# Patient Record
Sex: Female | Born: 1980 | Race: Black or African American | Hispanic: No | Marital: Single | Smoking: Current every day smoker
Health system: Southern US, Community
[De-identification: ages and names within clinical notes are randomized; demographics above are authoritative.]

## PROBLEM LIST (undated history)

## (undated) ENCOUNTER — Inpatient Hospital Stay (HOSPITAL_COMMUNITY): Payer: Self-pay

## (undated) DIAGNOSIS — R51 Headache: Secondary | ICD-10-CM

## (undated) DIAGNOSIS — K029 Dental caries, unspecified: Secondary | ICD-10-CM

## (undated) DIAGNOSIS — J349 Unspecified disorder of nose and nasal sinuses: Secondary | ICD-10-CM

## (undated) DIAGNOSIS — T7840XA Allergy, unspecified, initial encounter: Secondary | ICD-10-CM

## (undated) DIAGNOSIS — R112 Nausea with vomiting, unspecified: Secondary | ICD-10-CM

## (undated) DIAGNOSIS — Z9889 Other specified postprocedural states: Secondary | ICD-10-CM

## (undated) DIAGNOSIS — I10 Essential (primary) hypertension: Secondary | ICD-10-CM

## (undated) HISTORY — PX: WISDOM TOOTH EXTRACTION: SHX21

## (undated) HISTORY — PX: DILATION AND CURETTAGE OF UTERUS: SHX78

## (undated) HISTORY — PX: NOSE SURGERY: SHX723

## (undated) HISTORY — DX: Allergy, unspecified, initial encounter: T78.40XA

---

## 1999-02-01 ENCOUNTER — Emergency Department (HOSPITAL_COMMUNITY): Admission: EM | Admit: 1999-02-01 | Discharge: 1999-02-01 | Payer: Self-pay | Admitting: Emergency Medicine

## 1999-06-22 ENCOUNTER — Other Ambulatory Visit: Admission: RE | Admit: 1999-06-22 | Discharge: 1999-06-22 | Payer: Self-pay | Admitting: Obstetrics

## 2002-07-09 ENCOUNTER — Encounter: Payer: Self-pay | Admitting: Obstetrics

## 2002-07-09 ENCOUNTER — Ambulatory Visit (HOSPITAL_COMMUNITY): Admission: RE | Admit: 2002-07-09 | Discharge: 2002-07-09 | Payer: Self-pay | Admitting: Obstetrics

## 2002-07-11 ENCOUNTER — Ambulatory Visit (HOSPITAL_COMMUNITY): Admission: RE | Admit: 2002-07-11 | Discharge: 2002-07-11 | Payer: Self-pay | Admitting: Obstetrics

## 2002-07-11 ENCOUNTER — Encounter (INDEPENDENT_AMBULATORY_CARE_PROVIDER_SITE_OTHER): Payer: Self-pay

## 2003-04-09 ENCOUNTER — Inpatient Hospital Stay (HOSPITAL_COMMUNITY): Admission: AD | Admit: 2003-04-09 | Discharge: 2003-04-09 | Payer: Self-pay | Admitting: Obstetrics

## 2003-04-09 ENCOUNTER — Encounter: Payer: Self-pay | Admitting: Obstetrics

## 2003-06-05 ENCOUNTER — Inpatient Hospital Stay (HOSPITAL_COMMUNITY): Admission: AD | Admit: 2003-06-05 | Discharge: 2003-06-05 | Payer: Self-pay | Admitting: Obstetrics

## 2003-07-21 ENCOUNTER — Inpatient Hospital Stay (HOSPITAL_COMMUNITY): Admission: AD | Admit: 2003-07-21 | Discharge: 2003-07-22 | Payer: Self-pay | Admitting: Obstetrics

## 2003-08-06 ENCOUNTER — Inpatient Hospital Stay (HOSPITAL_COMMUNITY): Admission: AD | Admit: 2003-08-06 | Discharge: 2003-08-06 | Payer: Self-pay | Admitting: Obstetrics

## 2003-08-11 ENCOUNTER — Inpatient Hospital Stay (HOSPITAL_COMMUNITY): Admission: AD | Admit: 2003-08-11 | Discharge: 2003-08-11 | Payer: Self-pay | Admitting: Obstetrics

## 2003-08-15 ENCOUNTER — Encounter (INDEPENDENT_AMBULATORY_CARE_PROVIDER_SITE_OTHER): Payer: Self-pay | Admitting: *Deleted

## 2003-08-15 ENCOUNTER — Inpatient Hospital Stay (HOSPITAL_COMMUNITY): Admission: AD | Admit: 2003-08-15 | Discharge: 2003-08-18 | Payer: Self-pay | Admitting: Obstetrics

## 2003-12-20 ENCOUNTER — Inpatient Hospital Stay (HOSPITAL_COMMUNITY): Admission: AD | Admit: 2003-12-20 | Discharge: 2003-12-20 | Payer: Self-pay | Admitting: Obstetrics

## 2004-04-03 ENCOUNTER — Emergency Department (HOSPITAL_COMMUNITY): Admission: EM | Admit: 2004-04-03 | Discharge: 2004-04-03 | Payer: Self-pay | Admitting: Emergency Medicine
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
## 2007-02-09 ENCOUNTER — Emergency Department (HOSPITAL_COMMUNITY): Admission: EM | Admit: 2007-02-09 | Discharge: 2007-02-09 | Payer: Self-pay | Admitting: Family Medicine

## 2010-12-25 NOTE — H&P (Signed)
NAME:  ARIS, MOMAN                       ACCOUNT NO.:  [id number]   MEDICAL RECORD NO.:  [id number]                   PATIENT TYPE:  INP   LOCATION:  9199                                 FACILITY:  [facility]   PHYSICIAN:  Kathreen Cosier, M.D.           DATE OF BIRTH:  1981-04-02   DATE OF ADMISSION:  08/15/2003  DATE OF DISCHARGE:                                HISTORY & PHYSICAL   REASON FOR ADMISSION:  The patient is a 30 year old gravida 4, para 1-0-2-1,  [facility] August 18, 2003, admitted in labor and at 6:45 a.m. she was 7 cm, 80%  vertex, molded to a minus 2 station, membranes are ruptured spontaneously at  3:45 a.m. and the fluid was clear.  She is a negative GBS.  IUP and scalp  inserted at time.  The patient was started on Pitocin stimulation for an  inadequate labor and labor became adequate and at 8 a.m. she was unchanged  and 11:45 she was unchanged.  There was molding to a minus 2 station.  She  was also having late decelerations with each contraction.  She was given  terbutaline 0.25 mg subcu and O2 and it was decided she would be delivered  by a C-section for a nonreassuring fetal heart rate tracing and failure to  progress in labor.   PHYSICAL EXAMINATION:  GENERAL:  A well-developed female in labor.  HEENT:  Negative.  LUNGS:  Clear.  HEART:  Regular rhythm.  No murmurs, no gallops.  ABDOMEN:  Term size uterus with estimated fetal weight of 8 pounds.  EXTREMITIES:  Negative edema.                                               Kathreen Cosier, M.D.    BAM/MEDQ  D:  08/15/2003  T:  08/15/2003  Job:  [id number]

## 2010-12-25 NOTE — Discharge Summary (Signed)
NAME:  Brandy Reed, Brandy Reed                       ACCOUNT NO.:  [id number]   MEDICAL RECORD NO.:  [id number]                   PATIENT TYPE:  INP   LOCATION:  9115                                 [facility]ILITY[facility]  [facility]   PHYSICIAN:  Kathreen Cosier, M.D.           DATE OF BIRTH:  23-Aug-1980   DATE OF ADMISSION:  08/15/2003  DATE OF DISCHARGE:                                 DISCHARGE SUMMARY   HOSPITAL COURSE:  The patient is a 30 year old gravida 4, para 1-0-2-1, [facility]  August 18, 2003 who was admitted in labor and underwent a primary low  transverse cesarean section because of failure to progress.  She had a  female, Apgars 9 and 9 from the OP position weighing 7 pounds 4 ounces.  Postop her hemoglobin was 9.5.  She did well and was discharged home on the  third postoperative day, ambulatory and on a regular diet, on Tylox for pain  and Micronor for contraception.   DISCHARGE DIAGNOSIS:  Status post primary low transverse cesarean section at  term for failure to progress in labor.                                               Kathreen Cosier, M.D.    BAM/MEDQ  D:  08/18/2003  T:  08/18/2003  Job:  [id number]

## 2010-12-25 NOTE — Op Note (Signed)
   NAME:  Brandy Reed, Brandy Reed                       ACCOUNT NO.:  [id number]   MEDICAL RECORD NO.:  [id number]                   PATIENT TYPE:  AMB   LOCATION:  SDC                                  [facility]ILITY:  [facility]   PHYSICIAN:  Kathreen Cosier, M.D.           DATE OF BIRTH:  [birth year]   DATE OF PROCEDURE:  07/11/2002  DATE OF DISCHARGE:                                 OPERATIVE REPORT   PREOPERATIVE DIAGNOSES:  Spontaneous incomplete abortion.   PROCEDURE:  Dilatation and evacuation.  Using MAC patient lithotomy  position, perineum and vaginal prepped and draped.  Bladder emptied with  straight catheter.  Weighted speculum placed in the vagina.  Cervix was  injected at 3, 9, and 12 o'clock with a total of 8 cc of 1% Xylocaine.  Anterior lip of the cervix grasped with tenaculum.  Cervix was noted to be  open, easily admitted a number 50 Pratt.  Number 9 suction curette was  inserted into the endometrial cavity and the cavity curetted until clean.  Moderate amount of tissue obtained.  The patient tolerated procedure well.  Taken to recovery room in good condition.                                               Kathreen Cosier, M.D.    BAM/MEDQ  D:  07/11/2002  T:  07/11/2002  Job:  [id number]

## 2010-12-25 NOTE — Op Note (Signed)
NAME:  Brandy Reed, Brandy Reed                       ACCOUNT NO.:  [id number]   MEDICAL RECORD NO.:  [id number]                   PATIENT TYPE:  INP   LOCATION:  9199                                 [facility]ILITY[facility]   PHYSICIAN:  Kathreen Cosier, M.D.           DATE OF BIRTH:  01-30-1981   DATE OF PROCEDURE:  08/15/2003  DATE OF DISCHARGE:                                 OPERATIVE REPORT   PREOPERATIVE DIAGNOSES:  1. Failure to progress in labor.  2. Nonreassuring fetal heart rate tracing.   ANESTHESIA:  Epidural.   SURGEON:  Kathreen Cosier, M.D.   DESCRIPTION OF PROCEDURE:  The patient was placed on the operating table in  supine position, abdomen prepped and draped, bladder emptied with a Foley  catheter.  A transverse suprapubic incision made, carried down to the rectus  fascia, and fascia cleaned and incised the length of the incision.  The  recti muscles retracted laterally, the peritoneum incised longitudinally.  A  transverse incision made in the visceral peritoneum above the bladder and  the bladder mobilized inferiorly.  A transverse low uterine incision made,  the patient delivered from the OP position of a female, Apgar 9/9, weighing  7 pounds 4 ounces.  The fluid was clear, and the team was in attendance.  The placenta was posterior and removed manually.  The uterine cavity cleaned  with a dry lap.  The uterine incision closed in one layer with a continuous  suture of #1 chromic.  Hemostasis was satisfactory, the bladder flap  reattached with 2-0 chromic.  The uterus was well-contracted, tubes and  ovaries normal.  Abdomen closed in layers, peritoneum with a continuous  suture of 0 chromic, fascia with continuous suture of 0 Dexon, and the skin  closed with subcuticular stitch of 3-0 Monocryl.  Blood loss 400 mL.                                               Kathreen Cosier, M.D.    BAM/MEDQ  D:  08/15/2003  T:  08/15/2003  Job:  [id number]

## 2013-11-04 ENCOUNTER — Encounter (HOSPITAL_COMMUNITY): Payer: Self-pay | Admitting: Medical

## 2013-11-04 ENCOUNTER — Inpatient Hospital Stay (HOSPITAL_COMMUNITY): Payer: Medicaid Other

## 2013-11-04 ENCOUNTER — Inpatient Hospital Stay (HOSPITAL_COMMUNITY)
Admission: AD | Admit: 2013-11-04 | Discharge: 2013-11-04 | Discharge status: Against Medical Advice | Payer: Medicaid Other | Source: Ambulatory Visit | Attending: Obstetrics & Gynecology | Admitting: Obstetrics & Gynecology

## 2013-11-04 DIAGNOSIS — O00109 Unspecified tubal pregnancy without intrauterine pregnancy: Secondary | ICD-10-CM | POA: Insufficient documentation

## 2013-11-04 DIAGNOSIS — O2390 Unspecified genitourinary tract infection in pregnancy, unspecified trimester: Secondary | ICD-10-CM | POA: Insufficient documentation

## 2013-11-04 DIAGNOSIS — O26899 Other specified pregnancy related conditions, unspecified trimester: Secondary | ICD-10-CM

## 2013-11-04 DIAGNOSIS — N831 Corpus luteum cyst of ovary, unspecified side: Secondary | ICD-10-CM | POA: Insufficient documentation

## 2013-11-04 DIAGNOSIS — N760 Acute vaginitis: Secondary | ICD-10-CM | POA: Insufficient documentation

## 2013-11-04 DIAGNOSIS — A499 Bacterial infection, unspecified: Secondary | ICD-10-CM | POA: Insufficient documentation

## 2013-11-04 DIAGNOSIS — B9689 Other specified bacterial agents as the cause of diseases classified elsewhere: Secondary | ICD-10-CM | POA: Insufficient documentation

## 2013-11-04 DIAGNOSIS — O9989 Other specified diseases and conditions complicating pregnancy, childbirth and the puerperium: Secondary | ICD-10-CM

## 2013-11-04 DIAGNOSIS — R109 Unspecified abdominal pain: Secondary | ICD-10-CM | POA: Insufficient documentation

## 2013-11-04 DIAGNOSIS — O34599 Maternal care for other abnormalities of gravid uterus, unspecified trimester: Secondary | ICD-10-CM | POA: Insufficient documentation

## 2013-11-04 LAB — CBC
HCT: 35.6 % — ABNORMAL LOW (ref 36.0–46.0)
HEMOGLOBIN: 11.6 g/dL — AB (ref 12.0–15.0)
MCH: 27.2 pg (ref 26.0–34.0)
MCHC: 32.6 g/dL (ref 30.0–36.0)
MCV: 83.4 fL (ref 78.0–100.0)
Platelets: 457 10*3/uL — ABNORMAL HIGH (ref 150–400)
RBC: 4.27 MIL/uL (ref 3.87–5.11)
RDW: 14.6 % (ref 11.5–15.5)
WBC: 9.6 10*3/uL (ref 4.0–10.5)

## 2013-11-04 LAB — URINALYSIS, ROUTINE W REFLEX MICROSCOPIC
BILIRUBIN URINE: NEGATIVE
GLUCOSE, UA: NEGATIVE mg/dL
Ketones, ur: NEGATIVE mg/dL
NITRITE: NEGATIVE
PH: 6 (ref 5.0–8.0)
Protein, ur: NEGATIVE mg/dL
Specific Gravity, Urine: 1.025 (ref 1.005–1.030)
Urobilinogen, UA: 0.2 mg/dL (ref 0.0–1.0)

## 2013-11-04 LAB — URINE MICROSCOPIC-ADD ON

## 2013-11-04 LAB — WET PREP, GENITAL
Trich, Wet Prep: NONE SEEN
Yeast Wet Prep HPF POC: NONE SEEN

## 2013-11-04 LAB — HCG, QUANTITATIVE, PREGNANCY: hCG, Beta Chain, Quant, S: 2244 m[IU]/mL — ABNORMAL HIGH (ref <5)

## 2013-11-04 LAB — ABO/RH: ABO/RH(D): A POS

## 2013-11-04 LAB — POCT PREGNANCY, URINE: Preg Test, Ur: POSITIVE — AB

## 2013-11-04 NOTE — MAU Note (Signed)
Pt presents with complaints of lower abdominal pain that started this morning. Says that it is a possibility that she is pregnant and wants to make sure everything is ok if she is pregnant.

## 2013-11-04 NOTE — MAU Provider Note (Signed)
History     CSN: [id number]  Arrival date and time: 11/04/13 1501   First Provider Initiated Contact with Patient 11/04/13 1536      Chief Complaint  Patient presents with  . Abdominal Pain   HPI Ms. Brandy Reed is a 33 y.o. G1P0 at [redacted]w[redacted]d who presents to MAU today with complaint of lower abdominal pain that started today. She states LMP was mid or end of February. She rates her pain at 7/10 now. She has not taken anything for pain. She noted a pink discharge today, but denies bleeding. She denies other vaginal discharge, UTI symptoms, fever or N/V/D today.   OB History   Grav Para Term Preterm Abortions TAB SAB Ect Mult Living   1               No past medical history on file.  No past surgical history on file.  No family history on file.  History  Substance Use Topics  . Smoking status: Not on file  . Smokeless tobacco: Not on file  . Alcohol Use: Not on file    Allergies: No Known Allergies  Prescriptions prior to admission  Medication Sig Dispense Refill  . cetirizine (ZYRTEC) 10 MG tablet Take 10 mg by mouth daily.      Marland Kitchen FLUoxetine (PROZAC) 20 MG capsule Take 20 mg by mouth daily.      . fluticasone (FLONASE) 50 MCG/ACT nasal spray Place 1 spray into both nostrils daily as needed for allergies or rhinitis.      . Oxycodone HCl 10 MG TABS Take 10 mg by mouth 3 (three) times daily as needed (pain).        Review of Systems  Constitutional: Negative for fever and malaise/fatigue.  Gastrointestinal: Positive for abdominal pain. Negative for nausea, vomiting, diarrhea and constipation.  Genitourinary: Negative for dysuria, urgency and frequency.       Neg - vaginal bleeding, discharge   Physical Exam   Blood pressure 123/71, pulse 99, temperature 98.6 F (37 C), temperature source Oral, resp. rate 18, height 5\' 1"  (1.549 m), weight 185 lb (83.915 kg), last menstrual period 10/06/2013.  Physical Exam  Constitutional: She is oriented to person, place, and  time. She appears well-developed and well-nourished. No distress.  HENT:  Head: Normocephalic and atraumatic.  Cardiovascular: Normal rate.   Respiratory: Effort normal.  GI: Soft. Bowel sounds are normal. She exhibits no distension and no mass. There is no tenderness. There is no rebound and no guarding.  Genitourinary: Uterus is not enlarged (exam limited by maternal body habitus) and not tender. Cervix exhibits no motion tenderness, no discharge and no friability. Right adnexum displays tenderness (very mild). Right adnexum displays no mass. Left adnexum displays tenderness. Left adnexum displays no mass. No bleeding around the vagina. Vaginal discharge (scant thin, white discharge) found.  Neurological: She is alert and oriented to person, place, and time.  Skin: Skin is warm and dry. No erythema.  Psychiatric: She has a normal mood and affect.   Results for orders placed during the hospital encounter of 11/04/13 (from the past 24 hour(s))  URINALYSIS, ROUTINE W REFLEX MICROSCOPIC     Status: Abnormal   Collection Time    11/04/13  3:25 PM      Result Value Ref Range   Color, Urine YELLOW  YELLOW   APPearance CLEAR  CLEAR   Specific Gravity, Urine 1.025  1.005 - 1.030   pH 6.0  5.0 - 8.0  Glucose, UA NEGATIVE  NEGATIVE mg/dL   Hgb urine dipstick MODERATE (*) NEGATIVE   Bilirubin Urine NEGATIVE  NEGATIVE   Ketones, ur NEGATIVE  NEGATIVE mg/dL   Protein, ur NEGATIVE  NEGATIVE mg/dL   Urobilinogen, UA 0.2  0.0 - 1.0 mg/dL   Nitrite NEGATIVE  NEGATIVE   Leukocytes, UA TRACE (*) NEGATIVE  URINE MICROSCOPIC-ADD ON     Status: Abnormal   Collection Time    11/04/13  3:25 PM      Result Value Ref Range   Squamous Epithelial / LPF MANY (*) RARE   WBC, UA 3-6  <3 WBC/hpf   RBC / HPF 3-6  <3 RBC/hpf   Urine-Other MUCOUS PRESENT    POCT PREGNANCY, URINE     Status: Abnormal   Collection Time    11/04/13  3:29 PM      Result Value Ref Range   Preg Test, Ur POSITIVE (*) NEGATIVE  WET  PREP, GENITAL     Status: Abnormal   Collection Time    11/04/13  3:47 PM      Result Value Ref Range   Yeast Wet Prep HPF POC NONE SEEN  NONE SEEN   Trich, Wet Prep NONE SEEN  NONE SEEN   Clue Cells Wet Prep HPF POC MODERATE (*) NONE SEEN   WBC, Wet Prep HPF POC FEW (*) NONE SEEN  CBC     Status: Abnormal   Collection Time    11/04/13  4:00 PM      Result Value Ref Range   WBC 9.6  4.0 - 10.5 K/uL   RBC 4.27  3.87 - 5.11 MIL/uL   Hemoglobin 11.6 (*) 12.0 - 15.0 g/dL   HCT 16.1 (*) 09.6 - 04.5 %   MCV 83.4  78.0 - 100.0 fL   MCH 27.2  26.0 - 34.0 pg   MCHC 32.6  30.0 - 36.0 g/dL   RDW 40.9  81.1 - 91.4 %   Platelets 457 (*) 150 - 400 K/uL  HCG, QUANTITATIVE, PREGNANCY     Status: Abnormal   Collection Time    11/04/13  4:00 PM      Result Value Ref Range   hCG, Beta Chain, Quant, S 2244 (*) <5 mIU/mL  ABO/RH     Status: None   Collection Time    11/04/13  4:00 PM      Result Value Ref Range   ABO/RH(D) A POS     US Ob Comp Less 14 Wks  11/04/2013   CLINICAL DATA:  Pelvic pain and vaginal spotting. Six weeks and 3 days pregnant by last menstrual period. Quantitative beta HCG 2,244.  EXAM: OBSTETRIC <14 WK [place] AND TRANSVAGINAL OB US  TECHNIQUE: Both transabdominal and transvaginal ultrasound examinations were performed for complete evaluation of the gestation as well as the maternal uterus, adnexal regions, and pelvic cul-de-sac. Transvaginal technique was performed to assess early pregnancy.  COMPARISON:  None.  FINDINGS: Intrauterine gestational sac: Not visualized  Yolk sac:  Not visualized  Embryo:  Not visualized  Cardiac Activity: Not visualized  Maternal uterus/adnexae: Normal appearing right ovary. 2.3 cm and 1.6 cm simple cysts in the left ovary. There is also a complex cyst in the left ovary with diffuse wall thickening and internal septations. This measures 2.3 x 2.0 x 1.9 cm in maximum dimensions. No free peritoneal fluid.  IMPRESSION: 1. No intrauterine pregnancy seen. 2.  Probable complex left ovarian corpus luteum cyst. An ovarian ectopic pregnancy is much  less likely. Followup serial quantitative beta HCG determinations are recommended. If the beta HCG rises, a followup pelvic ultrasound would be recommended.   Electronically Signed   By: Gordan PaymentSteve  Reid M.D.   On: 11/04/2013 18:17   [place] Ob Transvaginal  11/04/2013   CLINICAL DATA:  Pelvic pain and vaginal spotting. Six weeks and 3 days pregnant by last menstrual period. Quantitative beta HCG 2,244.  EXAM: OBSTETRIC <14 WK US AND TRANSVAGINAL OB US  TECHNIQUE: Both transabdominal and transvaginal ultrasound examinations were performed for complete evaluation of the gestation as well as the maternal uterus, adnexal regions, and pelvic cul-de-sac. Transvaginal technique was performed to assess early pregnancy.  COMPARISON:  None.  FINDINGS: Intrauterine gestational sac: Not visualized  Yolk sac:  Not visualized  Embryo:  Not visualized  Cardiac Activity: Not visualized  Maternal uterus/adnexae: Normal appearing right ovary. 2.3 cm and 1.6 cm simple cysts in the left ovary. There is also a complex cyst in the left ovary with diffuse wall thickening and internal septations. This measures 2.3 x 2.0 x 1.9 cm in maximum dimensions. No free peritoneal fluid.  IMPRESSION: 1. No intrauterine pregnancy seen. 2. Probable complex left ovarian corpus luteum cyst. An ovarian ectopic pregnancy is much less likely. Followup serial quantitative beta HCG determinations are recommended. If the beta HCG rises, a followup pelvic ultrasound would be recommended.   Electronically Signed   By: Gordan PaymentSteve  Reid M.D.   On: 11/04/2013 18:17     MAU Course  Procedures None  MDM +UTP UA, Wet prep, GC/Chlamydia, ABO/Rh, quant hCG, CBC and US today Patient returned from US and then left MAU without consulting anyone prior to receiving final results or plan Attempted to contact patient by phone. LM for patient to return call to MAU.  Assessment and Plan   A: Positive pregnancy test Abdominal pain in pregnancy, antepartum Corpus luteum cyst vs ectopic pregnancy on the left Bacterial vaginosis  P: Patient left AMA prior to receiving final results Attempted to contact patient by phone with results. No answer. LM to call MAU Patient needs Rx for Flagyl sent to pharmacy once pharmacy is confirmed Patient also needs to return to MAU in 48 hours for follow-up quant hCG since IUP was not confirmed Patient should be given ectopic precautions  Freddi StarrJulie N Ethier, PA-C  11/04/2013, 6:28 PM

## 2013-11-04 NOTE — MAU Note (Signed)
Entered room to ask patient if she needed anything after returning from ultrasound to find room empty and patient gown lying on stretcher.

## 2013-11-05 LAB — GC/CHLAMYDIA PROBE AMP
CT PROBE, AMP APTIMA: NEGATIVE
GC PROBE AMP APTIMA: NEGATIVE

## 2014-02-02 ENCOUNTER — Inpatient Hospital Stay (HOSPITAL_COMMUNITY)
Admission: AD | Admit: 2014-02-02 | Discharge: 2014-02-03 | Discharge status: Against Medical Advice | Payer: Medicaid Other | Source: Ambulatory Visit | Attending: Obstetrics | Admitting: Obstetrics

## 2014-02-02 DIAGNOSIS — Z5320 Procedure and treatment not carried out because of patient's decision for unspecified reasons: Secondary | ICD-10-CM | POA: Insufficient documentation

## 2014-02-03 NOTE — MAU Note (Signed)
Not in lobby

## 2014-02-12 ENCOUNTER — Encounter (HOSPITAL_COMMUNITY): Payer: Self-pay | Admitting: *Deleted

## 2014-02-12 ENCOUNTER — Inpatient Hospital Stay (HOSPITAL_COMMUNITY)
Admission: AD | Admit: 2014-02-12 | Discharge: 2014-02-12 | Disposition: A | Discharge status: Home / Self Care | Payer: Medicaid Other | Source: Ambulatory Visit | Attending: Obstetrics | Admitting: Obstetrics

## 2014-02-12 ENCOUNTER — Inpatient Hospital Stay (HOSPITAL_COMMUNITY): Payer: Medicaid Other

## 2014-02-12 DIAGNOSIS — O2390 Unspecified genitourinary tract infection in pregnancy, unspecified trimester: Secondary | ICD-10-CM

## 2014-02-12 DIAGNOSIS — N390 Urinary tract infection, site not specified: Secondary | ICD-10-CM

## 2014-02-12 DIAGNOSIS — O2342 Unspecified infection of urinary tract in pregnancy, second trimester: Secondary | ICD-10-CM

## 2014-02-12 DIAGNOSIS — O99330 Smoking (tobacco) complicating pregnancy, unspecified trimester: Secondary | ICD-10-CM | POA: Insufficient documentation

## 2014-02-12 DIAGNOSIS — A499 Bacterial infection, unspecified: Secondary | ICD-10-CM | POA: Diagnosis not present

## 2014-02-12 DIAGNOSIS — B9689 Other specified bacterial agents as the cause of diseases classified elsewhere: Secondary | ICD-10-CM | POA: Insufficient documentation

## 2014-02-12 DIAGNOSIS — R109 Unspecified abdominal pain: Secondary | ICD-10-CM | POA: Insufficient documentation

## 2014-02-12 DIAGNOSIS — N760 Acute vaginitis: Secondary | ICD-10-CM | POA: Insufficient documentation

## 2014-02-12 HISTORY — DX: Unspecified disorder of nose and nasal sinuses: J34.9

## 2014-02-12 HISTORY — DX: Headache: R51

## 2014-02-12 LAB — WET PREP, GENITAL
TRICH WET PREP: NONE SEEN
Yeast Wet Prep HPF POC: NONE SEEN

## 2014-02-12 LAB — URINALYSIS, ROUTINE W REFLEX MICROSCOPIC
BILIRUBIN URINE: NEGATIVE
GLUCOSE, UA: NEGATIVE mg/dL
Ketones, ur: NEGATIVE mg/dL
Nitrite: NEGATIVE
PROTEIN: NEGATIVE mg/dL
Specific Gravity, Urine: 1.01 (ref 1.005–1.030)
UROBILINOGEN UA: 1 mg/dL (ref 0.0–1.0)
pH: 6.5 (ref 5.0–8.0)

## 2014-02-12 LAB — OB RESULTS CONSOLE GC/CHLAMYDIA
CHLAMYDIA, DNA PROBE: NEGATIVE
GC PROBE AMP, GENITAL: NEGATIVE

## 2014-02-12 LAB — URINE MICROSCOPIC-ADD ON

## 2014-02-12 MED ORDER — NITROFURANTOIN MONOHYD MACRO 100 MG PO CAPS: 100.0000 mg | ORAL_CAPSULE | Freq: Two times a day (BID) | ORAL | Status: DC

## 2014-02-12 MED ORDER — METRONIDAZOLE 500 MG PO TABS: 500.0000 mg | ORAL_TABLET | Freq: Two times a day (BID) | ORAL | Status: DC

## 2014-02-12 NOTE — MAU Provider Note (Signed)
Attestation of Attending Supervision of Advanced Practitioner (CNM/NP): Evaluation and management procedures were performed by the Advanced Practitioner under my supervision and collaboration. I have reviewed the Advanced Practitioner's note and chart, and I agree with the management and plan.  Colisha Redler H. 1:33 PM   

## 2014-02-12 NOTE — Progress Notes (Signed)
Patient was instructed to wait for discharge papers and e-sig. Patient was given verbal instrs by CNM then left without papers and e-sig.

## 2014-02-12 NOTE — MAU Provider Note (Signed)
History     CSN: [id number]  Arrival date and time: 02/12/14 [id number]   First Provider Initiated Contact with Patient 02/12/14 [phone]      Chief Complaint  Patient presents with  . Abdominal Pain   HPI  Pt is a 33 yo G6P4014 at 463w3d wks IUP by uncertainLMP.  Reports lower abdominal pain for several days. Pt stated came to MAU but did not wait to be seen on 02/03/14. States pain was worse this AM, feels like cramping. Across lower abdomen, intermittent. States she feels "irritated" when she urinates and her urine smells "strong." States she had an early U/S that did not show anything. States she was advised to return for F/U but never did. Has not had 1st appointment with Brandy Reed.  Denies vaginal bleeding or abnormal vaginal discharge.     Past Medical History  Diagnosis Date  . Depression     postpartum  . Sinus disorder     sometimes gets HA's  . YNWGNFAO(130.8Headache(784.0)     Past Surgical History  Procedure Laterality Date  . Cesarean section  X 3  . Dilation and curettage of uterus    . Nose surgery    . Wisdom tooth extraction      History reviewed. No pertinent family history.  History  Substance Use Topics  . Smoking status: Current Every Day Smoker -- 0.25 packs/day    Types: Cigars  . Smokeless tobacco: Never Used  . Alcohol Use: No    Allergies: No Known Allergies  No prescriptions prior to admission    Review of Systems  Gastrointestinal: Positive for abdominal pain (lower pelvis).  Genitourinary: Positive for dysuria and urgency. Negative for hematuria and flank pain.   Physical Exam   Blood pressure 123/62, pulse 80, temperature 98.4 F (36.9 C), temperature source Oral, resp. rate 18, height 5\' 3"  (1.6 m), weight 83.008 kg (183 lb), last menstrual period 10/06/2013.  Physical Exam  Constitutional: She is oriented to person, place, and time. She appears well-developed and well-nourished. No distress.  Appears uncomfortable  HENT:  Head: Normocephalic.   Neck: Normal range of motion. Neck supple.  Cardiovascular: Normal rate, regular rhythm and normal heart sounds.   Respiratory: Effort normal and breath sounds normal.  GI: Soft. There is no tenderness.  Genitourinary: No bleeding around the vagina. Vaginal discharge (white, creamy) found.  Neurological: She is alert and oriented to person, place, and time.  Skin: Skin is warm and dry.   Cervix - closed MAU Course  Procedures  Results for orders placed during the hospital encounter of 02/12/14 (from the past 24 hour(s))  URINALYSIS, ROUTINE W REFLEX MICROSCOPIC     Status: Abnormal   Collection Time    02/12/14  7:46 AM      Result Value Ref Range   Color, Urine YELLOW  YELLOW   APPearance HAZY (*) CLEAR   Specific Gravity, Urine 1.010  1.005 - 1.030   pH 6.5  5.0 - 8.0   Glucose, UA NEGATIVE  NEGATIVE mg/dL   Hgb urine dipstick MODERATE (*) NEGATIVE   Bilirubin Urine NEGATIVE  NEGATIVE   Ketones, ur NEGATIVE  NEGATIVE mg/dL   Protein, ur NEGATIVE  NEGATIVE mg/dL   Urobilinogen, UA 1.0  0.0 - 1.0 mg/dL   Nitrite NEGATIVE  NEGATIVE   Leukocytes, UA TRACE (*) NEGATIVE  URINE MICROSCOPIC-ADD ON     Status: Abnormal   Collection Time    02/12/14  7:46 AM  Result Value Ref Range   Squamous Epithelial / LPF FEW (*) RARE   WBC, UA 0-2  <3 WBC/hpf   RBC / HPF 3-6  <3 RBC/hpf   Bacteria, UA RARE  RARE  WET PREP, GENITAL     Status: Abnormal   Collection Time    02/12/14  8:57 AM      Result Value Ref Range   Yeast Wet Prep HPF POC NONE SEEN  NONE SEEN   Trich, Wet Prep NONE SEEN  NONE SEEN   Clue Cells Wet Prep HPF POC FEW (*) NONE SEEN   WBC, Wet Prep HPF POC FEW (*) NONE SEEN   OB Ultrasound: 18.6 wk IUP, normal scan  Assessment and Plan  33 yo [id number] at 337w3d wks IUP Abdominal Pain in Pregnancy UTI in Pregnancy Bacterial Vaginosis  Plan: Discharge to home RX Macrobid 100 mg BID x 7 days RX Flagyl 500 mg BID x 7 days Urine culture Schedule appt with Dr.  Gaynell Reed as planned  [facility],Brandy 02/12/2014, 8:32 AM

## 2014-02-12 NOTE — MAU Note (Addendum)
States she has had pain for several days. Came to MAU but did not wait to be seen. States pain was worse this AM. Across lower abdomen, intermittent. States she feels "irritated" when she pees and her urine smells "strong." States she had an early U/S that did not show anything. States she was advised to return for F/U but never did. Has not had 1st appointment with Dr. Gaynell FaceMarshall.

## 2014-02-13 LAB — URINE CULTURE
COLONY COUNT: NO GROWTH
Culture: NO GROWTH

## 2014-02-13 LAB — GC/CHLAMYDIA PROBE AMP
CT PROBE, AMP APTIMA: NEGATIVE
GC Probe RNA: NEGATIVE

## 2014-06-10 ENCOUNTER — Encounter (HOSPITAL_COMMUNITY): Payer: Self-pay | Admitting: *Deleted

## 2014-06-27 ENCOUNTER — Other Ambulatory Visit: Payer: Self-pay | Admitting: Obstetrics

## 2014-07-05 ENCOUNTER — Encounter (HOSPITAL_COMMUNITY)
Admission: RE | Admit: 2014-07-05 | Discharge: 2014-07-05 | Disposition: A | Discharge status: Home / Self Care | Payer: Medicaid Other | Source: Ambulatory Visit | Attending: Obstetrics | Admitting: Obstetrics

## 2014-07-05 ENCOUNTER — Encounter (HOSPITAL_COMMUNITY): Payer: Self-pay

## 2014-07-05 LAB — TYPE AND SCREEN
ABO/RH(D): A POS
ANTIBODY SCREEN: NEGATIVE

## 2014-07-05 LAB — CBC
HCT: 26.9 % — ABNORMAL LOW (ref 36.0–46.0)
HEMOGLOBIN: 8.8 g/dL — AB (ref 12.0–15.0)
MCH: 26 pg (ref 26.0–34.0)
MCHC: 32.7 g/dL (ref 30.0–36.0)
MCV: 79.6 fL (ref 78.0–100.0)
Platelets: 359 10*3/uL (ref 150–400)
RBC: 3.38 MIL/uL — ABNORMAL LOW (ref 3.87–5.11)
RDW: 14.6 % (ref 11.5–15.5)
WBC: 9.9 10*3/uL (ref 4.0–10.5)

## 2014-07-05 NOTE — Pre-Procedure Instructions (Addendum)
No prenatal labs available for me to chart in results console.Dr's office is closed. There are no labs results in EPIC except GC and chlamydia. Today is [place], the day after Thanksgiving and surgery is on Monday- [facility] and ask if they will obtain labs from office on Monday am.

## 2014-07-05 NOTE — H&P (Signed)
NAMEstill Dooms:  Brandy Reed, Brandy Reed             ACCOUNT NO.:  [id number]  MEDICAL RECORD NO.:  [id number]  LOCATION:                                 FACILITY:  PHYSICIAN:  Kathreen CosierBernard A. Marshall, M.D.DATE OF BIRTH:  03/10/1981  DATE OF ADMISSION: DATE OF DISCHARGE:                             HISTORY & PHYSICAL   HISTORY:  The patient is a 33 year old, gravida 6, para 4-0-1-4, who has had 3 C-sections and 1 vaginal delivery and 1 spontaneous abortion.  She is due on July 13, 2014 and is in for a repeat C-section.  Her GBS was positive.  Her prenatal care started late at 30 weeks and she never did get diabetic screen.  PAST MEDICAL HISTORY:  Negative.  PAST SURGICAL HISTORY:  C-sections x3.  SOCIAL HISTORY:  She smokes.  SYSTEMIC REVIEW:  Noncontributory.  PHYSICAL EXAMINATION:  GENERAL:  A well-developed female in no distress. HEENT:  Negative. LUNGS:  Clear to P and A. HEART:  Regular rhythm.  No murmurs.  No gallops. BREASTS:  Negative. ABDOMEN:  Term. PELVIC:  Deferred. EXTREMITIES:  Negative.          ______________________________ Kathreen CosierBernard A. Marshall, M.D.     BAM/MEDQ  D:  07/05/2014  T:  07/05/2014  Job:  [id number]

## 2014-07-05 NOTE — Pre-Procedure Instructions (Signed)
Pt had 2 high BP readings during PAT visit. Pt stated she ate ham yesterday. She states she usually has normal BP readings in office. I called office to alert Dr. Gaynell FaceMarshall but office is closed and Dr. Clearance CootsHarper on call. I didn't call Dr. Clearance CootsHarper since not an emergency but instructed patient to drink water and refrain from eating more ham.

## 2014-07-05 NOTE — Patient Instructions (Addendum)
Your procedure is scheduled on:07/08/14  Enter through the Main Entrance at : 11:15 am Pick up desk phone and dial [phone] and inform us of your arrival.  Please call [phone] if you have any problems the morning of surgery.  Remember: Do not eat food after midnight:Sunday Clear liquids are ok until: 8am    You may brush your teeth the morning of surgery.  DO NOT wear jewelry, eye make-up, lipstick,body lotion, or dark fingernail polish.  (Polished toes are ok) You may wear deodorant.  If you are to be admitted after surgery, leave suitcase in car until your room has been assigned. Patients discharged on the day of surgery will not be allowed to drive home. Wear loose fitting, comfortable clothes for your ride home.

## 2014-07-06 LAB — RPR

## 2014-07-07 ENCOUNTER — Inpatient Hospital Stay (HOSPITAL_COMMUNITY): Payer: Medicaid Other | Admitting: Anesthesiology

## 2014-07-07 ENCOUNTER — Encounter (HOSPITAL_COMMUNITY)
Admission: AD | Disposition: A | Discharge status: Home / Self Care | Payer: Self-pay | Source: Ambulatory Visit | Attending: Obstetrics

## 2014-07-07 ENCOUNTER — Inpatient Hospital Stay (HOSPITAL_COMMUNITY): Payer: Medicaid Other

## 2014-07-07 ENCOUNTER — Inpatient Hospital Stay (HOSPITAL_COMMUNITY)
Admission: AD | Admit: 2014-07-07 | Discharge: 2014-07-10 | DRG: 765 | Disposition: A | Discharge status: Home / Self Care | Payer: Medicaid Other | Source: Ambulatory Visit | Attending: Obstetrics | Admitting: Obstetrics

## 2014-07-07 ENCOUNTER — Encounter (HOSPITAL_COMMUNITY): Payer: Self-pay | Admitting: *Deleted

## 2014-07-07 DIAGNOSIS — Z3A39 39 weeks gestation of pregnancy: Secondary | ICD-10-CM | POA: Diagnosis present

## 2014-07-07 DIAGNOSIS — O99334 Smoking (tobacco) complicating childbirth: Secondary | ICD-10-CM | POA: Diagnosis present

## 2014-07-07 DIAGNOSIS — O288 Other abnormal findings on antenatal screening of mother: Secondary | ICD-10-CM | POA: Insufficient documentation

## 2014-07-07 DIAGNOSIS — O4693 Antepartum hemorrhage, unspecified, third trimester: Secondary | ICD-10-CM

## 2014-07-07 DIAGNOSIS — O1493 Unspecified pre-eclampsia, third trimester: Secondary | ICD-10-CM | POA: Diagnosis present

## 2014-07-07 DIAGNOSIS — Z98891 History of uterine scar from previous surgery: Secondary | ICD-10-CM

## 2014-07-07 DIAGNOSIS — O4690 Antepartum hemorrhage, unspecified, unspecified trimester: Secondary | ICD-10-CM | POA: Insufficient documentation

## 2014-07-07 DIAGNOSIS — O3421 Maternal care for scar from previous cesarean delivery: Secondary | ICD-10-CM | POA: Diagnosis present

## 2014-07-07 DIAGNOSIS — Z3483 Encounter for supervision of other normal pregnancy, third trimester: Secondary | ICD-10-CM | POA: Diagnosis present

## 2014-07-07 LAB — CBC
HCT: 27.7 % — ABNORMAL LOW (ref 36.0–46.0)
Hemoglobin: 9.1 g/dL — ABNORMAL LOW (ref 12.0–15.0)
MCH: 25.9 pg — AB (ref 26.0–34.0)
MCHC: 32.9 g/dL (ref 30.0–36.0)
MCV: 78.9 fL (ref 78.0–100.0)
PLATELETS: 342 10*3/uL (ref 150–400)
RBC: 3.51 MIL/uL — ABNORMAL LOW (ref 3.87–5.11)
RDW: 14.6 % (ref 11.5–15.5)
WBC: 13.2 10*3/uL — ABNORMAL HIGH (ref 4.0–10.5)

## 2014-07-07 LAB — URINALYSIS, ROUTINE W REFLEX MICROSCOPIC
BILIRUBIN URINE: NEGATIVE
Glucose, UA: NEGATIVE mg/dL
KETONES UR: 15 mg/dL — AB
NITRITE: POSITIVE — AB
Protein, ur: 30 mg/dL — AB
Specific Gravity, Urine: 1.01 (ref 1.005–1.030)
UROBILINOGEN UA: 1 mg/dL (ref 0.0–1.0)
pH: 6.5 (ref 5.0–8.0)

## 2014-07-07 LAB — COMPREHENSIVE METABOLIC PANEL
ALT: 10 U/L (ref 0–35)
AST: 20 U/L (ref 0–37)
Albumin: 2.7 g/dL — ABNORMAL LOW (ref 3.5–5.2)
Alkaline Phosphatase: 142 U/L — ABNORMAL HIGH (ref 39–117)
Anion gap: 13 (ref 5–15)
BUN: 6 mg/dL (ref 6–23)
CO2: 23 mEq/L (ref 19–32)
Calcium: 8.6 mg/dL (ref 8.4–10.5)
Chloride: 98 mEq/L (ref 96–112)
Creatinine, Ser: 0.64 mg/dL (ref 0.50–1.10)
GFR calc non Af Amer: >90 mL/min (ref >90)
Glucose, Bld: 114 mg/dL — ABNORMAL HIGH (ref 70–99)
Potassium: 3.4 mEq/L — ABNORMAL LOW (ref 3.7–5.3)
SODIUM: 134 meq/L — AB (ref 137–147)
TOTAL PROTEIN: 6.3 g/dL (ref 6.0–8.3)
Total Bilirubin: 0.3 mg/dL (ref 0.3–1.2)

## 2014-07-07 LAB — TYPE AND SCREEN
ABO/RH(D): A POS
Antibody Screen: NEGATIVE

## 2014-07-07 LAB — URINE MICROSCOPIC-ADD ON

## 2014-07-07 LAB — PROTEIN / CREATININE RATIO, URINE
Creatinine, Urine: 111.3 mg/dL
PROTEIN CREATININE RATIO: 0.58 — AB (ref 0.00–0.15)
TOTAL PROTEIN, URINE: 64.4 mg/dL

## 2014-07-07 LAB — URIC ACID: Uric Acid, Serum: 4.4 mg/dL (ref 2.4–7.0)

## 2014-07-07 LAB — LACTATE DEHYDROGENASE: LDH: 187 U/L (ref 94–250)

## 2014-07-07 SURGERY — Surgical Case
Anesthesia: Spinal | Site: Abdomen

## 2014-07-07 MED ORDER — MAGNESIUM SULFATE BOLUS VIA INFUSION
4.0000 g | Freq: Once | INTRAVENOUS | Status: AC
  Administered 2014-07-07: 4 g via INTRAVENOUS
  Filled 2014-07-07: qty 500

## 2014-07-07 MED ORDER — NALOXONE HCL 1 MG/ML IJ SOLN
1.0000 ug/kg/h | INTRAVENOUS | Status: DC | PRN
  Filled 2014-07-07: qty 2

## 2014-07-07 MED ORDER — MORPHINE SULFATE 0.5 MG/ML IJ SOLN
INTRAMUSCULAR | Status: AC
  Filled 2014-07-07: qty 10

## 2014-07-07 MED ORDER — NALOXONE HCL 0.4 MG/ML IJ SOLN: 0.4000 mg | INTRAMUSCULAR | Status: DC | PRN

## 2014-07-07 MED ORDER — ACETAMINOPHEN 500 MG PO TABS
1000.0000 mg | ORAL_TABLET | Freq: Four times a day (QID) | ORAL | Status: AC
  Administered 2014-07-07 – 2014-07-08 (×3): 1000 mg via ORAL
  Filled 2014-07-07 (×3): qty 2

## 2014-07-07 MED ORDER — PHENYLEPHRINE 8 MG IN D5W 100 ML (0.08MG/ML) PREMIX OPTIME
INJECTION | INTRAVENOUS | Status: AC
  Filled 2014-07-07: qty 100

## 2014-07-07 MED ORDER — DIBUCAINE 1 % RE OINT: 1.0000 "application " | TOPICAL_OINTMENT | RECTAL | Status: DC | PRN

## 2014-07-07 MED ORDER — SCOPOLAMINE 1 MG/3DAYS TD PT72
MEDICATED_PATCH | TRANSDERMAL | Status: AC
  Filled 2014-07-07: qty 1

## 2014-07-07 MED ORDER — FENTANYL CITRATE 0.05 MG/ML IJ SOLN
25.0000 ug | INTRAMUSCULAR | Status: DC | PRN
  Administered 2014-07-07 (×4): 50 ug via INTRAVENOUS

## 2014-07-07 MED ORDER — SIMETHICONE 80 MG PO CHEW
80.0000 mg | CHEWABLE_TABLET | ORAL | Status: DC | PRN
  Administered 2014-07-09 – 2014-07-10 (×3): 80 mg via ORAL
  Filled 2014-07-07: qty 1

## 2014-07-07 MED ORDER — MORPHINE SULFATE 4 MG/ML IJ SOLN
4.0000 mg | Freq: Once | INTRAMUSCULAR | Status: AC
  Administered 2014-07-07: 4 mg via INTRAVENOUS

## 2014-07-07 MED ORDER — MEPERIDINE HCL 25 MG/ML IJ SOLN: 6.2500 mg | INTRAMUSCULAR | Status: DC | PRN

## 2014-07-07 MED ORDER — MENTHOL 3 MG MT LOZG: 1.0000 | LOZENGE | OROMUCOSAL | Status: DC | PRN

## 2014-07-07 MED ORDER — DIPHENHYDRAMINE HCL 25 MG PO CAPS: 25.0000 mg | ORAL_CAPSULE | Freq: Four times a day (QID) | ORAL | Status: DC | PRN

## 2014-07-07 MED ORDER — MAGNESIUM SULFATE 40 G IN LACTATED RINGERS - SIMPLE
2.0000 g/h | INTRAVENOUS | Status: DC
  Administered 2014-07-08: 2 g/h via INTRAVENOUS
  Filled 2014-07-07 (×2): qty 500

## 2014-07-07 MED ORDER — LACTATED RINGERS IV SOLN
INTRAVENOUS | Status: DC
  Administered 2014-07-07 (×3): via INTRAVENOUS

## 2014-07-07 MED ORDER — FENTANYL CITRATE 0.05 MG/ML IJ SOLN
INTRAMUSCULAR | Status: AC
  Filled 2014-07-07: qty 2

## 2014-07-07 MED ORDER — ALBUTEROL SULFATE HFA 108 (90 BASE) MCG/ACT IN AERS
INHALATION_SPRAY | RESPIRATORY_TRACT | Status: AC
  Filled 2014-07-07: qty 6.7

## 2014-07-07 MED ORDER — KETOROLAC TROMETHAMINE 30 MG/ML IJ SOLN
30.0000 mg | Freq: Four times a day (QID) | INTRAMUSCULAR | Status: DC | PRN
Start: 2014-07-07 — End: 2014-07-07

## 2014-07-07 MED ORDER — ZOLPIDEM TARTRATE 5 MG PO TABS
5.0000 mg | ORAL_TABLET | Freq: Every evening | ORAL | Status: DC | PRN
Start: 2014-07-07 — End: 2014-07-10

## 2014-07-07 MED ORDER — NALBUPHINE HCL 10 MG/ML IJ SOLN: 5.0000 mg | INTRAMUSCULAR | Status: DC | PRN

## 2014-07-07 MED ORDER — SCOPOLAMINE 1 MG/3DAYS TD PT72
1.0000 | MEDICATED_PATCH | Freq: Once | TRANSDERMAL | Status: DC
  Filled 2014-07-07: qty 1

## 2014-07-07 MED ORDER — SIMETHICONE 80 MG PO CHEW
80.0000 mg | CHEWABLE_TABLET | Freq: Three times a day (TID) | ORAL | Status: DC
  Administered 2014-07-07 – 2014-07-10 (×8): 80 mg via ORAL
  Filled 2014-07-07 (×10): qty 1

## 2014-07-07 MED ORDER — DIPHENHYDRAMINE HCL 50 MG/ML IJ SOLN: 12.5000 mg | INTRAMUSCULAR | Status: DC | PRN

## 2014-07-07 MED ORDER — LACTATED RINGERS IV SOLN
INTRAVENOUS | Status: DC
  Administered 2014-07-07 – 2014-07-08 (×2): via INTRAVENOUS

## 2014-07-07 MED ORDER — NALBUPHINE HCL 10 MG/ML IJ SOLN: 5.0000 mg | Freq: Once | INTRAMUSCULAR | Status: AC | PRN

## 2014-07-07 MED ORDER — HYDROMORPHONE HCL 1 MG/ML IJ SOLN: 1.0000 mg | Freq: Once | INTRAMUSCULAR | Status: DC

## 2014-07-07 MED ORDER — HYDROMORPHONE HCL 1 MG/ML IJ SOLN
INTRAMUSCULAR | Status: AC
  Filled 2014-07-07: qty 1

## 2014-07-07 MED ORDER — ONDANSETRON HCL 4 MG/2ML IJ SOLN: 4.0000 mg | Freq: Three times a day (TID) | INTRAMUSCULAR | Status: DC | PRN

## 2014-07-07 MED ORDER — KETOROLAC TROMETHAMINE 30 MG/ML IJ SOLN: 30.0000 mg | Freq: Four times a day (QID) | INTRAMUSCULAR | Status: DC | PRN

## 2014-07-07 MED ORDER — OXYCODONE-ACETAMINOPHEN 5-325 MG PO TABS
2.0000 | ORAL_TABLET | ORAL | Status: DC | PRN
  Administered 2014-07-08 – 2014-07-10 (×9): 2 via ORAL
  Filled 2014-07-07 (×10): qty 2

## 2014-07-07 MED ORDER — CEFAZOLIN SODIUM-DEXTROSE 2-3 GM-% IV SOLR
2.0000 g | INTRAVENOUS | Status: AC
  Administered 2014-07-07: 2 g via INTRAVENOUS
  Filled 2014-07-07: qty 50

## 2014-07-07 MED ORDER — SCOPOLAMINE 1 MG/3DAYS TD PT72
MEDICATED_PATCH | TRANSDERMAL | Status: DC | PRN
  Administered 2014-07-07: 1 via TRANSDERMAL

## 2014-07-07 MED ORDER — FENTANYL CITRATE 0.05 MG/ML IJ SOLN
INTRAMUSCULAR | Status: DC | PRN
  Administered 2014-07-07: 25 ug via INTRAVENOUS
  Administered 2014-07-07: 25 ug via INTRATHECAL

## 2014-07-07 MED ORDER — DEXAMETHASONE SODIUM PHOSPHATE 4 MG/ML IJ SOLN
INTRAMUSCULAR | Status: DC | PRN
  Administered 2014-07-07: 4 mg via INTRAVENOUS

## 2014-07-07 MED ORDER — ONDANSETRON HCL 4 MG/2ML IJ SOLN
INTRAMUSCULAR | Status: DC | PRN
  Administered 2014-07-07: 4 mg via INTRAVENOUS

## 2014-07-07 MED ORDER — SIMETHICONE 80 MG PO CHEW
80.0000 mg | CHEWABLE_TABLET | ORAL | Status: DC
  Administered 2014-07-07 – 2014-07-10 (×3): 80 mg via ORAL
  Filled 2014-07-07 (×3): qty 1

## 2014-07-07 MED ORDER — PROMETHAZINE HCL 25 MG/ML IJ SOLN: 6.2500 mg | INTRAMUSCULAR | Status: DC | PRN

## 2014-07-07 MED ORDER — KETOROLAC TROMETHAMINE 30 MG/ML IJ SOLN
15.0000 mg | Freq: Once | INTRAMUSCULAR | Status: AC | PRN
  Administered 2014-07-07: 30 mg via INTRAVENOUS

## 2014-07-07 MED ORDER — LANOLIN HYDROUS EX OINT: 1.0000 "application " | TOPICAL_OINTMENT | CUTANEOUS | Status: DC | PRN

## 2014-07-07 MED ORDER — TETANUS-DIPHTH-ACELL PERTUSSIS 5-2.5-18.5 LF-MCG/0.5 IM SUSP
0.5000 mL | Freq: Once | INTRAMUSCULAR | Status: DC
  Filled 2014-07-07: qty 0.5

## 2014-07-07 MED ORDER — SENNOSIDES-DOCUSATE SODIUM 8.6-50 MG PO TABS
2.0000 | ORAL_TABLET | ORAL | Status: DC
  Administered 2014-07-07 – 2014-07-10 (×3): 2 via ORAL
  Filled 2014-07-07 (×3): qty 2

## 2014-07-07 MED ORDER — DIPHENHYDRAMINE HCL 25 MG PO CAPS: 25.0000 mg | ORAL_CAPSULE | ORAL | Status: DC | PRN

## 2014-07-07 MED ORDER — OXYCODONE-ACETAMINOPHEN 5-325 MG PO TABS
1.0000 | ORAL_TABLET | ORAL | Status: DC | PRN
  Administered 2014-07-07 – 2014-07-09 (×3): 1 via ORAL
  Filled 2014-07-07 (×3): qty 1

## 2014-07-07 MED ORDER — HYDROMORPHONE HCL 1 MG/ML IJ SOLN
1.0000 mg | Freq: Once | INTRAMUSCULAR | Status: AC
  Administered 2014-07-07: 1 mg via INTRAVENOUS

## 2014-07-07 MED ORDER — MORPHINE SULFATE (PF) 0.5 MG/ML IJ SOLN
INTRAMUSCULAR | Status: DC | PRN
  Administered 2014-07-07: .15 mg via INTRATHECAL

## 2014-07-07 MED ORDER — BUPIVACAINE IN DEXTROSE 0.75-8.25 % IT SOLN
INTRATHECAL | Status: DC | PRN
  Administered 2014-07-07: 12 mg via INTRATHECAL

## 2014-07-07 MED ORDER — IBUPROFEN 600 MG PO TABS
600.0000 mg | ORAL_TABLET | Freq: Four times a day (QID) | ORAL | Status: DC
  Administered 2014-07-07 – 2014-07-10 (×13): 600 mg via ORAL
  Filled 2014-07-07 (×14): qty 1

## 2014-07-07 MED ORDER — IBUPROFEN 600 MG PO TABS
600.0000 mg | ORAL_TABLET | Freq: Four times a day (QID) | ORAL | Status: DC | PRN
  Administered 2014-07-09: 600 mg via ORAL

## 2014-07-07 MED ORDER — ONDANSETRON HCL 4 MG/2ML IJ SOLN
INTRAMUSCULAR | Status: AC
  Filled 2014-07-07: qty 2

## 2014-07-07 MED ORDER — SODIUM CHLORIDE 0.9 % IJ SOLN: 3.0000 mL | INTRAMUSCULAR | Status: DC | PRN

## 2014-07-07 MED ORDER — CITRIC ACID-SODIUM CITRATE 334-500 MG/5ML PO SOLN
30.0000 mL | Freq: Once | ORAL | Status: AC
  Administered 2014-07-07: 30 mL via ORAL

## 2014-07-07 MED ORDER — MIDAZOLAM HCL 2 MG/2ML IJ SOLN: 0.5000 mg | Freq: Once | INTRAMUSCULAR | Status: DC | PRN

## 2014-07-07 MED ORDER — OXYTOCIN 10 UNIT/ML IJ SOLN
40.0000 [IU] | INTRAVENOUS | Status: DC | PRN
  Administered 2014-07-07: 40 [IU] via INTRAVENOUS

## 2014-07-07 MED ORDER — OXYTOCIN 40 UNITS IN LACTATED RINGERS INFUSION - SIMPLE MED: 62.5000 mL/h | INTRAVENOUS | Status: AC

## 2014-07-07 MED ORDER — NITROFURANTOIN MONOHYD MACRO 100 MG PO CAPS
100.0000 mg | ORAL_CAPSULE | Freq: Two times a day (BID) | ORAL | Status: DC
  Administered 2014-07-07 – 2014-07-10 (×7): 100 mg via ORAL
  Filled 2014-07-07 (×9): qty 1

## 2014-07-07 MED ORDER — OXYTOCIN 10 UNIT/ML IJ SOLN
INTRAMUSCULAR | Status: AC
  Filled 2014-07-07: qty 4

## 2014-07-07 MED ORDER — KETOROLAC TROMETHAMINE 30 MG/ML IJ SOLN
INTRAMUSCULAR | Status: AC
  Filled 2014-07-07: qty 1

## 2014-07-07 MED ORDER — PRENATAL MULTIVITAMIN CH
1.0000 | ORAL_TABLET | Freq: Every day | ORAL | Status: DC
  Administered 2014-07-08 – 2014-07-10 (×3): 1 via ORAL
  Filled 2014-07-07 (×3): qty 1

## 2014-07-07 MED ORDER — MORPHINE SULFATE 4 MG/ML IJ SOLN
INTRAMUSCULAR | Status: AC
  Filled 2014-07-07: qty 1

## 2014-07-07 MED ORDER — PHENYLEPHRINE 8 MG IN D5W 100 ML (0.08MG/ML) PREMIX OPTIME
INJECTION | INTRAVENOUS | Status: DC | PRN
  Administered 2014-07-07: 50 ug/min via INTRAVENOUS

## 2014-07-07 MED ORDER — ONDANSETRON HCL 4 MG/2ML IJ SOLN: 4.0000 mg | INTRAMUSCULAR | Status: DC | PRN

## 2014-07-07 MED ORDER — CITRIC ACID-SODIUM CITRATE 334-500 MG/5ML PO SOLN
ORAL | Status: AC
  Filled 2014-07-07: qty 15

## 2014-07-07 MED ORDER — WITCH HAZEL-GLYCERIN EX PADS: 1.0000 "application " | MEDICATED_PAD | CUTANEOUS | Status: DC | PRN

## 2014-07-07 MED ORDER — ONDANSETRON HCL 4 MG PO TABS: 4.0000 mg | ORAL_TABLET | ORAL | Status: DC | PRN

## 2014-07-07 SURGICAL SUPPLY — 39 items
ADH SKN CLS LQ APL DERMABOND (GAUZE/BANDAGES/DRESSINGS) ×2
CLAMP CORD UMBIL (MISCELLANEOUS) IMPLANT
CLOTH BEACON ORANGE TIMEOUT ST (SAFETY) ×3 IMPLANT
DERMABOND ADHESIVE PROPEN (GAUZE/BANDAGES/DRESSINGS) ×4
DERMABOND ADVANCED .7 DNX6 (GAUZE/BANDAGES/DRESSINGS) IMPLANT
DRAPE SHEET LG 3/4 BI-LAMINATE (DRAPES) IMPLANT
DRSG OPSITE 11X17.75 LRG (GAUZE/BANDAGES/DRESSINGS) ×2 IMPLANT
DRSG OPSITE POSTOP 4X10 (GAUZE/BANDAGES/DRESSINGS) ×1 IMPLANT
DURAPREP 26ML APPLICATOR (WOUND CARE) ×3 IMPLANT
ELECT REM PT RETURN 9FT ADLT (ELECTROSURGICAL) ×3
ELECTRODE REM PT RTRN 9FT ADLT (ELECTROSURGICAL) ×1 IMPLANT
EXTRACTOR VACUUM M CUP 4 TUBE (SUCTIONS) IMPLANT
EXTRACTOR VACUUM M CUP 4' TUBE (SUCTIONS)
GLOVE BIO SURGEON STRL SZ8.5 (GLOVE) ×3 IMPLANT
GOWN STRL REUS W/TWL 2XL LVL3 (GOWN DISPOSABLE) ×3 IMPLANT
GOWN STRL REUS W/TWL LRG LVL3 (GOWN DISPOSABLE) ×3 IMPLANT
KIT ABG SYR 3ML LUER SLIP (SYRINGE) IMPLANT
LIQUID BAND (GAUZE/BANDAGES/DRESSINGS) ×3 IMPLANT
NDL HYPO 25X5/8 SAFETYGLIDE (NEEDLE) ×1 IMPLANT
NEEDLE HYPO 25X5/8 SAFETYGLIDE (NEEDLE) ×3 IMPLANT
NS IRRIG 1000ML POUR BTL (IV SOLUTION) ×3 IMPLANT
PACK C SECTION WH (CUSTOM PROCEDURE TRAY) ×3 IMPLANT
PAD OB MATERNITY 4.3X12.25 (PERSONAL CARE ITEMS) ×3 IMPLANT
STAPLER VISISTAT 35W (STAPLE) IMPLANT
SUT CHROMIC 0 CT 802H (SUTURE) ×3 IMPLANT
SUT CHROMIC 0 CTX 36 (SUTURE) ×2 IMPLANT
SUT CHROMIC 0 MO4 CR (SUTURE) IMPLANT
SUT CHROMIC 1 CTX 36 (SUTURE) ×6 IMPLANT
SUT CHROMIC 2 0 SH (SUTURE) ×3 IMPLANT
SUT GUT PLAIN 0 CT-3 TAN 27 (SUTURE) IMPLANT
SUT MON AB 4-0 PS1 27 (SUTURE) ×3 IMPLANT
SUT PDS AB 0 CTX 36 PDP370T (SUTURE) IMPLANT
SUT VIC AB 0 CT1 18XCR BRD8 (SUTURE) IMPLANT
SUT VIC AB 0 CT1 8-18 (SUTURE)
SUT VIC AB 0 CTX 36 (SUTURE) ×6
SUT VIC AB 0 CTX36XBRD ANBCTRL (SUTURE) ×2 IMPLANT
TOWEL OR 17X24 6PK STRL BLUE (TOWEL DISPOSABLE) ×3 IMPLANT
TRAY FOLEY CATH 14FR (SET/KITS/TRAYS/PACK) ×3 IMPLANT
WATER STERILE IRR 1000ML POUR (IV SOLUTION) ×3 IMPLANT

## 2014-07-07 NOTE — Progress Notes (Signed)
Notified Dr Clearance CootsHarper of patients blood pressure results since coming to mother baby unit.

## 2014-07-07 NOTE — Op Note (Signed)
Preop diagnosis previous cesarean section at term in labor Postop diagnosis same Surgeon Dr. Francoise CeoBernard Jamin Reed (Asst. Dr. Coral Ceoharles Harper Anesthesia spinal Procedure patient placed on the operating table in the supine position after the spinal administered abdomen prepped and draped bladder emptied with a Foley catheter a transverse suprapubic incision made through the old scar carried to the rectus fascia fascia cleaned and incised the length of the incision recti muscles retracted laterally peritoneum incised longitudinally transverse incision made on the visceroperitoneum above the bladder and the bladder mobilized inferiorly transverse lower uterine incision made fluid clear patient delivered from the OB position of a female Apgar 9 and 9 weighing 8 lbs. 13 oz. the placenta was posterior removed manually and sent to labor and delivery uterine cavity clean with dry laps the uterine incision closed in one layer with continuous suture of #1 chromic hemostasis was satisfactory lap and sponge counts correct abdomen closed in layers peritoneum continuous with 2-0 chromic fascia continuous with of 0 Dexon and the skin shows a subcuticular stitch of 4-0 Monocryl blood loss was 700 cc patient tolerated the procedure well

## 2014-07-07 NOTE — H&P (Signed)
Patient scheduled for repeat C-section tomorrow and no comes in in labor and her C-section was done this a.m. the history and physical was already dictated

## 2014-07-07 NOTE — Progress Notes (Signed)
Pt has returned from "smoking". Discussed to effects of smoking on the pt as well as the baby. Instructed the pt not to leave the floor again for safety reasons as well. Nicotine patch was offered. Pt declined. Pt verbalized understanding of POC and agreed not to leave the hospital again. Also talked to pt about the risk of SIDS with parents that smoke around their newborn-pt verbalized understanding.

## 2014-07-07 NOTE — Progress Notes (Signed)
Pt just arrived to unit at 1728 and was asking to ambulate in the hallway prior to being started on Magnesium. [facility] staff asked to accompany pt to help with IV pole and foley. Pt ambulated to the elevator and told [facility] she was going to smoke. Security asked to retrieve pt. AC was made aware.

## 2014-07-07 NOTE — Progress Notes (Signed)
Acknowledged MD order for CSW consult.  Attempted visit with mother.  She was just assigned to a regular room and had several visitors.  She requested CSW return at a later time.

## 2014-07-07 NOTE — Progress Notes (Signed)
Notified Dr Clearance CootsHarper of patients blood pressures, see new orders.

## 2014-07-07 NOTE — Progress Notes (Signed)
Security unable to get pt to come back to room. Dr. Clearance CootsHarper called to report pt behavior. Will discuss with pt.

## 2014-07-07 NOTE — Anesthesia Preprocedure Evaluation (Signed)
Anesthesia Evaluation  Patient identified by MRN, date of birth, ID band Patient awake    Reviewed: Allergy & Precautions, H&P , NPO status , Patient's Chart, lab work & pertinent test results  Airway Mallampati: II       Dental   Pulmonary Current Smoker,  breath sounds clear to auscultation        Cardiovascular Exercise Tolerance: Good Rhythm:regular Rate:Normal     Neuro/Psych    GI/Hepatic   Endo/Other    Renal/GU      Musculoskeletal   Abdominal   Peds  Hematology   Anesthesia Other Findings Marginal insertion with bleeding NPO since 2am but OB says needs to go before adequately NPO  Reproductive/Obstetrics (+) Pregnancy                             Anesthesia Physical Anesthesia Plan  ASA: III and emergent  Anesthesia Plan: Spinal   Post-op Pain Management:    Induction:   Airway Management Planned:   Additional Equipment:   Intra-op Plan:   Post-operative Plan:   Informed Consent: I have reviewed the patients History and Physical, chart, labs and discussed the procedure including the risks, benefits and alternatives for the proposed anesthesia with the patient or authorized representative who has indicated his/her understanding and acceptance.     Plan Discussed with: Anesthesiologist, CRNA and Surgeon  Anesthesia Plan Comments:         Anesthesia Quick Evaluation

## 2014-07-07 NOTE — Plan of Care (Signed)
Problem: Phase I Progression Outcomes Goal: IS, TCDB as ordered Outcome: Completed/Met Date Met:  07/07/14

## 2014-07-07 NOTE — Anesthesia Procedure Notes (Signed)
Spinal Patient location during procedure: OR Start time: 07/07/2014 5:58 AM Staffing Anesthesiologist: Brayton CavesJACKSON, Salene Mohamud Performed by: anesthesiologist  Preanesthetic Checklist Completed: patient identified, site marked, surgical consent, pre-op evaluation, timeout performed, IV checked, risks and benefits discussed and monitors and equipment checked Spinal Block Patient position: sitting Prep: DuraPrep Patient monitoring: heart rate, cardiac monitor, continuous pulse ox and blood pressure Approach: midline Location: L3-4 Injection technique: single-shot Needle Needle type: Sprotte  Needle gauge: 24 G Needle length: 9 cm Assessment Sensory level: T4 Additional Notes Patient identified.  Risk benefits discussed including failed block, incomplete pain control, headache, nerve damage, paralysis, blood pressure changes, nausea, vomiting, reactions to medication both toxic or allergic, and postpartum back pain.  Patient expressed understanding and wished to proceed.  All questions were answered.  Sterile technique used throughout procedure.  CSF was clear.  No parasthesia or other complications.  Please see nursing notes for vital signs.

## 2014-07-07 NOTE — Transfer of Care (Signed)
Immediate Anesthesia Transfer of Care Note  Patient: Brandy Reed  Procedure(s) Performed: Procedure(s): CESAREAN SECTION (N/A)  Patient Location: PACU  Anesthesia Type:Spinal  Level of Consciousness: awake, alert  and oriented  Airway & Oxygen Therapy: Patient Spontanous Breathing  Post-op Assessment: Report given to PACU RN and Post -op Vital signs reviewed and stable  Post vital signs: Reviewed and stable  Complications: No apparent anesthesia complications

## 2014-07-07 NOTE — MAU Provider Note (Cosign Needed)
History     CSN: [id number]  Arrival date and time: 07/07/14 [id number]   First Provider Initiated Contact with Patient 07/07/14 [phone]      Chief Complaint  Patient presents with  . Vaginal Bleeding   HPI  Ms. Brandy Reed is a 33 y.o. [id number] at 947w1d who presents to MAU today with complaint of vaginal bleeding and contractions. She states that the bleeding started tonight. She is unsure of how much bleeding she has had, but states that there was "a lot" on her hand when she first noticed the bleeding. She states that she has been having more frequent contractions. She noted ~ q 5 minutes today. She denies any complications with this pregnancy, however states that she had elevated BP on Pfohl when she was at her pre-operative appointment. Patient is scheduled for a repeat C/S on Monday. She has had 3 previous c/s. She denies headache, blurred vision, floaters or abdominal pain today. She states mild bilateral ankle edema. She reports good fetal movement.   OB History    Gravida Para Term Preterm AB TAB SAB Ectopic Multiple Living   6 4 4  1  1   4       Past Medical History  Diagnosis Date  . Depression     postpartum  . Sinus disorder     sometimes gets HA's  . IONGEXBM(841.3Headache(784.0)     Past Surgical History  Procedure Laterality Date  . Cesarean section  X 3  . Dilation and curettage of uterus    . Nose surgery    . Wisdom tooth extraction      No family history on file.  History  Substance Use Topics  . Smoking status: Current Every Day Smoker -- 0.25 packs/day    Types: Cigars  . Smokeless tobacco: Never Used  . Alcohol Use: No    Allergies: No Known Allergies  Prescriptions prior to admission  Medication Sig Dispense Refill Last Dose  . metroNIDAZOLE (FLAGYL) 500 MG tablet Take 1 tablet (500 mg total) by mouth 2 (two) times daily. 14 tablet 0   . nitrofurantoin, macrocrystal-monohydrate, (MACROBID) 100 MG capsule Take 1 capsule (100 mg total) by mouth 2 (two)  times daily. 14 capsule 0     Review of Systems  Constitutional: Negative for fever and malaise/fatigue.  Eyes: Negative for blurred vision.  Gastrointestinal: Positive for abdominal pain.  Genitourinary:       + vaginal bleeding  Neurological: Negative for headaches.   Physical Exam   Blood pressure 160/79, pulse 91, temperature 98.8 F (37.1 C), temperature source Oral, resp. rate 22, last menstrual period 10/06/2013.  Physical Exam  Constitutional: She is oriented to person, place, and time. She appears well-developed and well-nourished. No distress.  HENT:  Head: Normocephalic.  Cardiovascular: Normal rate.   Respiratory: Effort normal.  GI: Soft. She exhibits no distension and no mass. There is no tenderness. There is no rebound and no guarding.  Genitourinary: There is bleeding (moderate amount of blood in the vaginal vault) in the vagina.  Musculoskeletal: She exhibits no edema.  Neurological: She is alert and oriented to person, place, and time. She has normal reflexes.  No clonus  Skin: Skin is warm and dry. No erythema.  Psychiatric: She has a normal mood and affect.     Results for orders placed or performed during the hospital encounter of 07/07/14 (from the past 24 hour(s))  Urinalysis, Routine w reflex microscopic     Status: Abnormal  Collection Time: 07/07/14  3:45 AM  Result Value Ref Range   Color, Urine YELLOW YELLOW   APPearance HAZY (A) CLEAR   Specific Gravity, Urine 1.010 1.005 - 1.030   pH 6.5 5.0 - 8.0   Glucose, UA NEGATIVE NEGATIVE mg/dL   Hgb urine dipstick LARGE (A) NEGATIVE   Bilirubin Urine NEGATIVE NEGATIVE   Ketones, ur 15 (A) NEGATIVE mg/dL   Protein, ur 30 (A) NEGATIVE mg/dL   Urobilinogen, UA 1.0 0.0 - 1.0 mg/dL   Nitrite POSITIVE (A) NEGATIVE   Leukocytes, UA SMALL (A) NEGATIVE  Protein / creatinine ratio, urine     Status: Abnormal   Collection Time: 07/07/14  3:45 AM  Result Value Ref Range   Creatinine, Urine 111.30 mg/dL    Total Protein, Urine 64.4 mg/dL   Protein Creatinine Ratio 0.58 (H) 0.00 - 0.15  Urine microscopic-add on     Status: Abnormal   Collection Time: 07/07/14  3:45 AM  Result Value Ref Range   Squamous Epithelial / LPF FEW (A) RARE   WBC, UA 0-2 <3 WBC/hpf   RBC / HPF 11-20 <3 RBC/hpf   Bacteria, UA MANY (A) RARE  CBC     Status: Abnormal   Collection Time: 07/07/14  3:50 AM  Result Value Ref Range   WBC 13.2 (H) 4.0 - 10.5 K/uL   RBC 3.51 (L) 3.87 - 5.11 MIL/uL   Hemoglobin 9.1 (L) 12.0 - 15.0 g/dL   HCT 16.127.7 (L) 09.636.0 - 04.546.0 %   MCV 78.9 78.0 - 100.0 fL   MCH 25.9 (L) 26.0 - 34.0 pg   MCHC 32.9 30.0 - 36.0 g/dL   RDW 40.914.6 81.111.5 - 91.415.5 %   Platelets 342 150 - 400 K/uL  Comprehensive metabolic panel     Status: Abnormal   Collection Time: 07/07/14  3:50 AM  Result Value Ref Range   Sodium 134 (L) 137 - 147 mEq/L   Potassium 3.4 (L) 3.7 - 5.3 mEq/L   Chloride 98 96 - 112 mEq/L   CO2 23 19 - 32 mEq/L   Glucose, Bld 114 (H) 70 - 99 mg/dL   BUN 6 6 - 23 mg/dL   Creatinine, Ser 7.820.64 0.50 - 1.10 mg/dL   Calcium 8.6 8.4 - 95.610.5 mg/dL   Total Protein 6.3 6.0 - 8.3 g/dL   Albumin 2.7 (L) 3.5 - 5.2 g/dL   AST 20 0 - 37 U/L   ALT 10 0 - 35 U/L   Alkaline Phosphatase 142 (H) 39 - 117 U/L   Total Bilirubin 0.3 0.3 - 1.2 mg/dL   GFR calc non Af Amer >90 >90 mL/min   GFR calc Af Amer >90 >90 mL/min   Anion gap 13 5 - 15  Uric acid     Status: None   Collection Time: 07/07/14  3:50 AM  Result Value Ref Range   Uric Acid, Serum 4.4 2.4 - 7.0 mg/dL  Lactate dehydrogenase     Status: None   Collection Time: 07/07/14  3:50 AM  Result Value Ref Range   LDH 187 94 - 250 U/L    Patient Vitals for the past 24 hrs:  BP Temp Temp src Pulse Resp  07/07/14 0346 160/79 mmHg - - 91 -  07/07/14 0334 172/86 mmHg - - 100 -  07/07/14 0317 173/90 mmHg - - 97 -  07/07/14 0315 160/97 mmHg - - 100 -  07/07/14 0310 165/88 mmHg - - 100 -  07/07/14 0308 165/86 mmHg - -  100 -  07/07/14 0253 170/98 mmHg  98.8 F (37.1 C) Oral 101 22    Fetal Monitoring: Baseline: 140 bpm, moderate variability, no accelerations, no decelerations Contractions: irregular q 2-6 minutes MAU Course  Procedures None  MDM [place] and BPP to assess fetal well-being and placenta due to non-reactive NST and vaginal bleeding that RN reports is more than bloody show BP elevated today. Patient is asymptomatic for pre-eclampsia and has normal exam. Labs ordered as noted below.  UA, urine protein/creatinine ratio, CBC, CMP, Uric acid, LDH and US/BPP today Fern - negative 0500 - Discussed patient, labs and [place] with Dr. Clearance Coots. Requests that we start IV fluid and draw type and screen. He will be here to consent the patient for surgery shortly.  Type and screen drawn. IV started.  1191 - Dr. Clearance Coots in MAU to assess patient. Patient is prepared for OR. Dr. Gaynell Face also in MAU.  Assessment and Plan  A: [facility]UP at [redacted]w[redacted]d Pre-eclampsia Vaginal bleeding in pregnancy  Previous C/S x 3  P: Patient to OR with Dr. Clearance Coots and Dr. Pollyann Samples, PA-C  07/07/2014, 5:27 AM

## 2014-07-07 NOTE — Anesthesia Postprocedure Evaluation (Signed)
  Anesthesia Post-op Note  Patient: Brandy Reed  Procedure(s) Performed: Procedure(s): CESAREAN SECTION (N/A)  Patient Location: PACU  Anesthesia Type:Spinal  Level of Consciousness: awake, alert  and oriented  Airway and Oxygen Therapy: Patient Spontanous Breathing  Post-op Pain: none  Post-op Assessment: Post-op Vital signs reviewed, Patient's Cardiovascular Status Stable, Respiratory Function Stable, Patent Airway, No signs of Nausea or vomiting, Pain level controlled, No headache, No backache, No residual numbness and No residual motor weakness  Post-op Vital Signs: Reviewed and stable  Last Vitals:  Filed Vitals:   07/07/14 0800  BP: 119/63  Pulse: 81  Temp:   Resp: 16    Complications: No apparent anesthesia complications

## 2014-07-07 NOTE — Anesthesia Preprocedure Evaluation (Signed)
Anesthesia Evaluation  Patient identified by MRN, date of birth, ID band Patient awake    Reviewed: Allergy & Precautions, H&P , NPO status , Patient's Chart, lab work & pertinent test results  Airway Mallampati: II       Dental   Pulmonary Current Smoker,  breath sounds clear to auscultation        Cardiovascular Exercise Tolerance: Good Rhythm:regular Rate:Normal     Neuro/Psych    GI/Hepatic   Endo/Other    Renal/GU      Musculoskeletal   Abdominal   Peds  Hematology   Anesthesia Other Findings Marginal insertion with bleeding NPO since 2am but OB says needs to go before adequately NPO  Reproductive/Obstetrics (+) Pregnancy                             Anesthesia Physical Anesthesia Plan  ASA: III and emergent  Anesthesia Plan: Spinal   Post-op Pain Management:    Induction:   Airway Management Planned:   Additional Equipment:   Intra-op Plan:   Post-operative Plan:   Informed Consent: I have reviewed the patients History and Physical, chart, labs and discussed the procedure including the risks, benefits and alternatives for the proposed anesthesia with the patient or authorized representative who has indicated his/her understanding and acceptance.     Plan Discussed with: Anesthesiologist, CRNA and Surgeon  Anesthesia Plan Comments:         Anesthesia Quick Evaluation  

## 2014-07-07 NOTE — Plan of Care (Signed)
Problem: Phase I Progression Outcomes Goal: Pain controlled with appropriate interventions Outcome: Completed/Met Date Met:  07/07/14 Goal: Foley catheter patent Outcome: Completed/Met Date Met:  07/07/14 Goal: OOB as tolerated unless otherwise ordered Outcome: Completed/Met Date Met:  07/07/14 Goal: VS, stable, temp < 100.4 degrees F Outcome: Completed/Met Date Met:  07/07/14 Goal: Initial discharge plan identified Outcome: Completed/Met Date Met:  07/07/14

## 2014-07-08 ENCOUNTER — Inpatient Hospital Stay (HOSPITAL_COMMUNITY): Admission: RE | Admit: 2014-07-08 | Payer: Medicaid Other | Source: Ambulatory Visit | Admitting: Obstetrics

## 2014-07-08 ENCOUNTER — Encounter (HOSPITAL_COMMUNITY): Payer: Self-pay | Admitting: Obstetrics

## 2014-07-08 DIAGNOSIS — O288 Other abnormal findings on antenatal screening of mother: Secondary | ICD-10-CM | POA: Insufficient documentation

## 2014-07-08 DIAGNOSIS — O4690 Antepartum hemorrhage, unspecified, unspecified trimester: Secondary | ICD-10-CM | POA: Insufficient documentation

## 2014-07-08 DIAGNOSIS — Z3A39 39 weeks gestation of pregnancy: Secondary | ICD-10-CM | POA: Insufficient documentation

## 2014-07-08 LAB — CBC
HEMATOCRIT: 22.6 % — AB (ref 36.0–46.0)
Hemoglobin: 7.4 g/dL — ABNORMAL LOW (ref 12.0–15.0)
MCH: 26 pg (ref 26.0–34.0)
MCHC: 32.7 g/dL (ref 30.0–36.0)
MCV: 79.3 fL (ref 78.0–100.0)
Platelets: 334 10*3/uL (ref 150–400)
RBC: 2.85 MIL/uL — AB (ref 3.87–5.11)
RDW: 14.7 % (ref 11.5–15.5)
WBC: 11.6 10*3/uL — AB (ref 4.0–10.5)

## 2014-07-08 SURGERY — Surgical Case
Anesthesia: Regional

## 2014-07-08 MED ORDER — LABETALOL HCL 200 MG PO TABS
200.0000 mg | ORAL_TABLET | Freq: Three times a day (TID) | ORAL | Status: DC
  Administered 2014-07-08 – 2014-07-10 (×7): 200 mg via ORAL
  Filled 2014-07-08 (×8): qty 1

## 2014-07-08 MED ORDER — OXYCODONE HCL 5 MG PO TABS
5.0000 mg | ORAL_TABLET | ORAL | Status: DC | PRN
  Administered 2014-07-08 – 2014-07-09 (×3): 5 mg via ORAL
  Filled 2014-07-08 (×3): qty 1

## 2014-07-08 MED ORDER — NIFEDIPINE 10 MG PO CAPS: 200.0000 mg | ORAL_CAPSULE | Freq: Three times a day (TID) | ORAL | Status: DC

## 2014-07-08 NOTE — Plan of Care (Signed)
Problem: Phase II Progression Outcomes Goal: Pain controlled on oral analgesia Outcome: Progressing Goal: Progress activity as tolerated unless otherwise ordered Outcome: Completed/Met Date Met:  07/08/14 Goal: Incision intact & without signs/symptoms of infection Outcome: Completed/Met Date Met:  07/08/14

## 2014-07-08 NOTE — Anesthesia Postprocedure Evaluation (Signed)
  Anesthesia Post-op Note  Patient: Brandy Reed  Procedure(s) Performed: * No procedures listed *  Patient Location: A-ICU  Anesthesia Type:Spinal  Level of Consciousness: awake, alert , oriented and patient cooperative  Airway and Oxygen Therapy: Patient Spontanous Breathing  Post-op Pain: mild  Post-op Assessment: Post-op Vital signs reviewed, Patient's Cardiovascular Status Stable, Respiratory Function Stable, Patent Airway, No headache, No backache, No residual numbness and No residual motor weakness  Post-op Vital Signs: Reviewed and stable  Last Vitals:  Filed Vitals:   07/08/14 0600  BP: 154/89  Pulse: 76  Temp: 36.4 C  Resp: 16    Complications: No apparent anesthesia complications

## 2014-07-08 NOTE — Progress Notes (Signed)
Patient ID: Brandy RushingJoesanna D Henneke, female   DOB: 07/08/1981, 33 y.o.   MRN: [id number] Postop day 1 Her blood pressures became elevated off the C-section yesterday and she was transferred to magnum to the ICU and started on magnesium sulfate which was discontinued after 24 hours Patient feels fine Dressing dry No complaints

## 2014-07-08 NOTE — Progress Notes (Signed)
UR chart review completed.  

## 2014-07-08 NOTE — Progress Notes (Signed)
CSW received message that Brandy Reed is assigned CPS worker ([phone]).  CPS reported intention to meet with the MOB at the hospital on 12/1 after 11:00 am.   CSW will continue to closely follow and will collaborate with CPS once assessment has been completed.  

## 2014-07-08 NOTE — Plan of Care (Signed)
Problem: Phase II Progression Outcomes Goal: Rh isoimmunization per orders Outcome: Not Applicable Date Met:  08/12/02

## 2014-07-08 NOTE — Progress Notes (Signed)
CSW made CPS report with [place] due to baby's UDS being positive for cocaine.   CSW completed assessment with the MOB, but MOB was guarded, difficult to engage, and denied any substance use even when confronted with UDS result.   CPS to visit with the MOB while at the hospital.  No discharge until CPS meets with the MOB and provides recommendations for discharge.  

## 2014-07-08 NOTE — Progress Notes (Signed)
Clinical Social Work Department PSYCHOSOCIAL ASSESSMENT - MATERNAL/CHILD 07/08/2014  Patient:  Brandy Reed, Brandy Reed  Account Number:  [id number]  Admit Date:  07/07/2014  Ardine Eng Name:   Brandy Reed   Clinical Social Worker:  Lucita Ferrara, CLINICAL SOCIAL WORKER   Date/Time:  07/08/2014 09:30 AM  Date Referred:  07/07/2014   Referral source  Central Nursery     Referred reason  [facility]  Depression/Anxiety   Other referral source:    I:  FAMILY / HOME ENVIRONMENT Child's legal guardian:  PARENT  Guardian - Name Guardian - Age Guardian - Address  Brandy Reed 33 [address] [place],  [address]  Brandy Reed  same as above   Other household support members/support persons Name Relationship DOB   child 2001   child 2005   child 2011   child 2013   Other support:   FOB reported having "strong support".  He stated that the [place]le[place] lives in [place] and is helping with the other children.    II  PSYCHOSOCIAL DATA Information Source:  Family Interview  Financial and [employer] Employment:   FOB stated that he is self-employed. The MOB stated that she stays at home with the children.   Financial resources:  Medicaid If Medicaid - County:  [place] / Grade:  N/A Music therapist / Child Services Coordination / Early Interventions:   None reported  Cultural issues impacting care:   None reported    III  STRENGTHS Strengths  Adequate Resources  Home prepared for Child (including basic supplies)   Strength comment:    IV  RISK FACTORS AND CURRENT PROBLEMS Current Problem:  YES   Risk Factor & Current Problem Patient Issue Family Issue Risk Factor / Current Problem Comment  Substance Abuse Y N Baby's UDS is positive for cocaine.  The MOB denied any substance use and is unsure why the baby is positive for cocaine.  Other - See comment Y N MOB arrived late to prenatal care. She had initial  prenatal appointment at 73 weeks.  Mental Illness Y N MOB presents with history of postpartum depression.    V  SOCIAL WORK ASSESSMENT CSW met with the MOB due to MOB presenting late to prenatal care and having a history of postpartum depression.  MOB provided consent for the FOB to be present for the assessment.  MOB and FOB presented as difficult to engage.  MOB and FOB were observed to have their eyes closed during the visit and provided short/concise answers when CSW asked open ended questions.  MOB displayed a limited range in affect, presented as guarded, and did not ask any questions or voice concerns during the entire visit.  MOB minimally discussed how she feels secondary to having a newborn in the home again.  The FOB answered the majority of questions that were asked, and stated that he believes that they are well supported and that the home is well prepared.  MOB briefly mentioned that she had difficulties obtaining Medicaid which is why she arrived late to prenatal care.  CSW shared hospital drug screen policy due to late prenatal care, and CSW stated that the baby's UDS is positive for cocaine.  MOB stated that she does not know how the baby's UDS is positive since she denied all substance use.  CSW noted that the MOB's affect did not change (was calm, displayed minimal concern) when CSW shared results, and MOB did not ask any questions  related to the health of the baby or subsequent steps since the baby's UDS is positive.  CSW shared that CPS report will be made and that a CPS worker will meet with the MOB prior to discharge.  MOB and FOB minimally acknowledged the statement, and denied any questions or concerns.  The MOB acknowledged history of CPS involvement, "for one of my other kids", but was guarded and did not provide additional information.      VI SOCIAL WORK PLAN Social Work Plan  Child Scientist, forensic Report   Type of pt/family education:   Hospital drug screen policy    If child protective services report - county:  [place] If child protective services report - date:  07/08/2014  CSW spoke with Brandy Reed, intake, at [facility] in order to make report due to baby's UDS being positive for cocaine.  Per Brandy Reed, CPS worker will meet with the family at the hospital.   Information/referral to community resources comment:  CSW will collaborate with CPS and ensure that any needed referrals are made.  Other social work plan:   CSW to continue to follow. CSW will follow-up with CPS to receive discharge recommendations.

## 2014-07-09 ENCOUNTER — Encounter (HOSPITAL_COMMUNITY): Payer: Self-pay | Admitting: *Deleted

## 2014-07-09 NOTE — Progress Notes (Signed)
CSW [facility], CPS worker. Per CPS, she is unable to complete assessment with the MOB on 12/1.  She confirmed contact information for the MOB, and discussed that either herself or another CPS worker will be at the hospital on 12/2 to initiate report and complete assessment.  She stated that she must complete the visit by 12/2 since the report has been classified as a "72 hour response".    CSW will continue to follow.

## 2014-07-09 NOTE — Progress Notes (Signed)
Patient ID: [name], female   DOB: 06/15/1981, 33 y.o.   MRN: [id number] Postop day 2 Vital signs normal Magnesium was discontinued last night and she is on labetalol 200 mg 3 times a day legs negative Baby is positive for cocaine but mom insists that she has never used the drug in her life

## 2014-07-09 NOTE — Plan of Care (Signed)
Problem: Phase II Progression Outcomes Goal: Pain controlled on oral analgesia Outcome: Completed/Met Date Met:  07/09/14 Goal: Afebrile, VS remain stable Outcome: Completed/Met Date Met:  07/09/14

## 2014-07-09 NOTE — Plan of Care (Signed)
Problem: Phase II Progression Outcomes Goal: Tolerating diet Outcome: Completed/Met Date Met:  07/09/14

## 2014-07-10 NOTE — Discharge Instructions (Signed)
Discharge instructions   You can wash your hair  Shower  Eat what you want  Drink what you want  See me in 6 weeks  Your ankles are going to swell more in the next 2 weeks than when pregnant  No sex for 6 weeks   Kento Gossman A, MD 07/10/2014

## 2014-07-10 NOTE — Progress Notes (Signed)
Patient ID: Haze RushingJoesanna D Bastarache, female   DOB: 11/11/1980, 33 y.o.   MRN: [id number] Postop day 3 Vital signs normal Fundus firm Dressing dry Legs negative home today on labetalol 200 by mouth 3 times a day to see me in one week for blood pressure check

## 2014-07-10 NOTE — Plan of Care (Signed)
Problem: Phase I Progression Outcomes Goal: Voiding adequately Outcome: Completed/Met Date Met:  07/10/14  Problem: Discharge Progression Outcomes Goal: Activity appropriate for discharge plan Outcome: Completed/Met Date Met:  07/10/14 Goal: Tolerating diet Outcome: Completed/Met Date Met:  07/10/14

## 2014-07-10 NOTE — Plan of Care (Signed)
Problem: Discharge Progression Outcomes Goal: Barriers To Progression Addressed/Resolved Outcome: Not Applicable Date Met:  35/39/12 Goal: Complications resolved/controlled Outcome: Completed/Met Date Met:  07/10/14 Goal: Pain controlled with appropriate interventions Outcome: Completed/Met Date Met:  07/10/14 Goal: Afebrile, VS remain stable at discharge Outcome: Completed/Met Date Met:  07/10/14 Goal: Discharge plan in place and appropriate Outcome: Completed/Met Date Met:  07/10/14

## 2014-07-10 NOTE — Discharge Summary (Signed)
Obstetric Discharge Summary Reason for Admission: cesarean section Prenatal Procedures: none Intrapartum Procedures: cesarean: low cervical, transverse Postpartum Procedures: none Complications-Operative and Postpartum: none HEMOGLOBIN  Date Value Ref Range Status  07/08/2014 7.4* 12.0 - 15.0 g/dL Final    Comment:    DELTA CHECK NOTED REPEATED TO VERIFY    HCT  Date Value Ref Range Status  07/08/2014 22.6* 36.0 - 46.0 % Final    Physical Exam:  General: alert Lochia: appropriate Uterine Fundus: firm Incision: healing well DVT Evaluation: No evidence of DVT seen on physical exam.  Discharge Diagnoses: Term Pregnancy-delivered  Discharge Information: Date: 07/10/2014 Activity: pelvic rest Diet: routine Medications: Percocet Condition: stable Instructions: refer to practice specific booklet Discharge to: home Follow-up Information    Follow up with Kathreen CosierMARSHALL,BERNARD A, MD.   Specialty:  Obstetrics and Gynecology   Contact information:   [address] [place] [address] [phone]       Newborn Data: Live born female  Birth Weight: 8 lb 13 oz (3997 g) APGAR: 9, 9  Home with mother.  MARSHALL,BERNARD A 07/10/2014, 6:11 AM

## 2014-07-11 NOTE — Progress Notes (Signed)
CSW received call from security desk stating that MOB had made threats suggesting that she was not leaving the hospital without her baby.  CSW informed CPS worker of this by stating that MOB would have to leave the hospital now that she no longer has a medical reason to be admitted and that law enforcement can be called to escort her out if necessary.  CSW also stated that CPS will be called if MOB attempts to take baby out of the NICU against medical advice since this would be detrimental to the health of the baby.  CSW suggested that CPS would need to take emergency 12 hour custody in that case.  CSW informed security of this and spoke with attending/Dr. Dimaguila.  CSW called NICU secretary to ensure that she has the after hours CPS reporting number in the event CPS is needed after hours.  Secretary and other NICU staff aware of after hours plan.  Dr. Dimaguila informed CSW that MOB stated that she had not been informed of the reason baby was in the NICU and stated understanding now that she has spoken with the doctor.   

## 2015-03-28 ENCOUNTER — Emergency Department (HOSPITAL_COMMUNITY)
Admission: EM | Admit: 2015-03-28 | Discharge: 2015-03-28 | Disposition: A | Discharge status: Home / Self Care | Payer: Medicaid Other | Attending: Emergency Medicine | Admitting: Emergency Medicine

## 2015-03-28 ENCOUNTER — Encounter (HOSPITAL_COMMUNITY): Payer: Self-pay | Admitting: Emergency Medicine

## 2015-03-28 DIAGNOSIS — Y939 Activity, unspecified: Secondary | ICD-10-CM | POA: Diagnosis not present

## 2015-03-28 DIAGNOSIS — S025XXA Fracture of tooth (traumatic), initial encounter for closed fracture: Secondary | ICD-10-CM | POA: Insufficient documentation

## 2015-03-28 DIAGNOSIS — Y929 Unspecified place or not applicable: Secondary | ICD-10-CM | POA: Insufficient documentation

## 2015-03-28 DIAGNOSIS — Z8659 Personal history of other mental and behavioral disorders: Secondary | ICD-10-CM | POA: Insufficient documentation

## 2015-03-28 DIAGNOSIS — K029 Dental caries, unspecified: Secondary | ICD-10-CM | POA: Diagnosis not present

## 2015-03-28 DIAGNOSIS — X58XXXA Exposure to other specified factors, initial encounter: Secondary | ICD-10-CM | POA: Diagnosis not present

## 2015-03-28 DIAGNOSIS — K088 Other specified disorders of teeth and supporting structures: Secondary | ICD-10-CM | POA: Diagnosis present

## 2015-03-28 DIAGNOSIS — Z720 Tobacco use: Secondary | ICD-10-CM | POA: Diagnosis not present

## 2015-03-28 DIAGNOSIS — Y998 Other external cause status: Secondary | ICD-10-CM | POA: Insufficient documentation

## 2015-03-28 MED ORDER — HYDROCODONE-ACETAMINOPHEN 5-325 MG PO TABS: ORAL_TABLET | ORAL | Status: DC

## 2015-03-28 MED ORDER — AMOXICILLIN 500 MG PO CAPS: 500.0000 mg | ORAL_CAPSULE | Freq: Three times a day (TID) | ORAL | Status: DC

## 2015-03-28 MED ORDER — BUPIVACAINE-EPINEPHRINE (PF) 0.5% -1:200000 IJ SOLN
1.8000 mL | Freq: Once | INTRAMUSCULAR | Status: AC
  Administered 2015-03-28: 1.8 mL

## 2015-03-28 NOTE — ED Notes (Signed)
Pt reports she chipped one of her top teeth yesterday and is now having severe pain from it. States her dentist is closed today so she could not see them.

## 2015-03-28 NOTE — ED Provider Notes (Signed)
CSN: [id number]     Arrival date & time 03/28/15  1247 History  This chart was scribed for Wynetta Emery, PA-C, working with Blane Ohara, MD by Elon Spanner, ED Scribe. This patient was seen in room TR10C/TR10C and the patient's care was started at 12:55 PM.   Chief Complaint  Patient presents with  . Dental Pain   The history is provided by the patient. No language interpreter was used.    HPI Comments: Brandy Reed is a 34 y.o. female who presents to the Emergency Department complaining of worsening, constant, moderate left upper/lower dental pain onset yesterday after chipping a tooth.  Associated symptoms include inability to sleep due to pain.   She has used Orajel with transient relief and OTC pain medications.  NKA. Denies fever/chills, difficulty opening jaw, difficulty swallowing, SOB, gum swelling, facial swelling, neck swelling.   Past Medical History  Diagnosis Date  . Depression     postpartum  . Sinus disorder     sometimes gets HA's  . EXBMWUXL(244.0)    Past Surgical History  Procedure Laterality Date  . Cesarean section  X 3  . Dilation and curettage of uterus    . Nose surgery    . Wisdom tooth extraction    . Cesarean section N/A 07/07/2014    Procedure: CESAREAN SECTION;  Surgeon: Kathreen Cosier, MD;  Location: [facility];  Service: Obstetrics;  Laterality: N/A;   No family history on file. Social History  Substance Use Topics  . Smoking status: Current Every Day Smoker -- 0.25 packs/day    Types: Cigars  . Smokeless tobacco: Never Used  . Alcohol Use: No   OB History    Gravida Para Term Preterm AB TAB SAB Ectopic Multiple Living   6 5 5  1  1   0 5     Review of Systems  HENT: Positive for dental problem.   All other systems reviewed and are negative.     Allergies  Review of patient's allergies indicates no known allergies.  Home Medications   Prior to Admission medications   Medication Sig Start Date End Date Taking? Authorizing  Provider  amoxicillin (AMOXIL) 500 MG capsule Take 1 capsule (500 mg total) by mouth 3 (three) times daily. 03/28/15   Kayon Dozier, PA-C  HYDROcodone-acetaminophen (NORCO/VICODIN) 5-325 MG per tablet Take 1-2 tablets by mouth every 6 hours as needed for pain and/or cough. 03/28/15   Pricella Gaugh, PA-C   BP 157/90 mmHg  Pulse 89  Temp(Src) 98.6 F (37 C) (Oral)  Resp 20  Ht 5\' 3"  (1.6 m)  SpO2 100%  LMP 03/28/2015 (Exact Date) Physical Exam  Constitutional: She is oriented to person, place, and time. She appears well-developed and well-nourished. No distress.  HENT:  Head: Normocephalic and atraumatic.  Mouth/Throat: Uvula is midline and oropharynx is clear and moist. No trismus in the jaw. No uvula swelling. No oropharyngeal exudate, posterior oropharyngeal edema, posterior oropharyngeal erythema or tonsillar abscesses.  Generally poor dentition with multiple dental caries eroding into the gumline, Ellis type II fracture as diagrammed.   No gingival swelling, erythema or tenderness to palpation. Patient is handling their secretions. There is no tenderness to palpation or firmness underneath tongue bilaterally. No trismus.    Eyes: Conjunctivae and EOM are normal.  Neck: Neck supple. No tracheal deviation present.  Cardiovascular: Normal rate.   Pulmonary/Chest: Effort normal. No stridor. No respiratory distress.  Musculoskeletal: Normal range of motion.  Lymphadenopathy:    She  has no cervical adenopathy.  Neurological: She is alert and oriented to person, place, and time.  Skin: Skin is warm and dry.  Psychiatric: She has a normal mood and affect. Her behavior is normal.  Nursing note and vitals reviewed.   ED Course  NERVE BLOCK Date/Time: 03/28/2015 1:17 PM Performed by: Wynetta Emery Authorized by: Wynetta Emery Consent: Verbal consent obtained. Consent given by: patient Patient identity confirmed: verbally with patient Indications: pain relief Body area:  face/mouth Nerve: anterior superior alveolar Laterality: left Patient sedated: no Patient position: sitting Needle gauge: 27 G Location technique: anatomical landmarks Local anesthetic: bupivacaine 0.5% with epinephrine Anesthetic total: 1.8 ml Outcome: pain improved Patient tolerance: Patient tolerated the procedure well with no immediate complications  Dental Date/Time: 03/28/2015 1:17 PM Performed by: Wynetta Emery Authorized by: Wynetta Emery Consent: Verbal consent obtained. Consent given by: patient Patient identity confirmed: verbally with patient Local anesthesia used: yes Anesthesia: nerve block Patient sedated: no Patient tolerance: Patient tolerated the procedure well with no immediate complications Comments: Dical mix to cover Dentin on tooth 11   (including critical care time)  DIAGNOSTIC STUDIES: Oxygen Saturation is 100% on RA, normal by my interpretation.    COORDINATION OF CARE:  1:03PM Discussed treatment plan with patient at bedside.  Patient acknowledges and agrees with plan.     Labs Review Labs Reviewed - No data to display  Imaging Review No results found. I have personally reviewed and evaluated these images and lab results as part of my medical decision-making.   EKG Interpretation None      MDM   Final diagnoses:  Tooth fracture, closed, initial encounter    Filed Vitals:   03/28/15 1252  BP: 157/90  Pulse: 89  Temp: 98.6 F (37 C)  TempSrc: Oral  Resp: 20  Height:  (1.6 m)  SpO2: 100%    Medications  bupivacaine-epinephrine (MARCAINE W/ EPI) 0.5% -1:200000 injection 1.8 mL (1.8 mLs Infiltration Given 03/28/15 1335)    Brandy Reed is a pleasant 34 y.o. female presenting with significant dental caries and fracture to tooth #11, Ellis type II. Posterior superior alveolar block given and Dical applied to cover the pulp. Patient has an established dentist, advised her to follow closely with them and to  maintain a full soft/liquid diet until the Dical is removed.  Evaluation does not show pathology that would require ongoing emergent intervention or inpatient treatment. Pt is hemodynamically stable and mentating appropriately. Discussed findings and plan with patient/guardian, who agrees with care plan. All questions answered. Return precautions discussed and outpatient follow up given.   Discharge Medication List as of 03/28/2015  1:36 PM    START taking these medications   Details  amoxicillin (AMOXIL) 500 MG capsule Take 1 capsule (500 mg total) by mouth 3 (three) times daily., Starting 03/28/2015, Until Discontinued, Print    HYDROcodone-acetaminophen (NORCO/VICODIN) 5-325 MG per tablet Take 1-2 tablets by mouth every 6 hours as needed for pain and/or cough., Print         I personally performed the services described in this documentation, which was scribed in my presence. The recorded information has been reviewed and is accurate.    Wynetta Emery, PA-C 03/28/15 1405  Blane Ohara, MD 03/29/15 [phone]

## 2015-03-28 NOTE — Discharge Instructions (Signed)
You must have a soft liquid diet until the putty is removed by your dentist.  Return to the emergency room for fever, change in vision, redness to the face that rapidly spreads towards the eye, nausea or vomiting, difficulty swallowing or shortness of breath.  Take your antibiotics as directed and to the end of the course.   Take vicodin for breakthrough pain, do not drink alcohol, drive, care for children or do other critical tasks while taking vicodin.  Followup with a dentist is very important for ongoing evaluation and management of recurrent dental pain. Return to emergency department for emergent changing or worsening symptoms."  Dental Fracture You have a dental fracture or injury. This can mean the tooth is loose, has a chip in the enamel or is broken. If just the outer enamel is chipped, there is a good chance the tooth will not become infected. The only treatment needed may be to smooth off a rough edge. Fractures into the deeper layers (dentin and pulp) cause greater pain and are more likely to become infected. These require you to see a dentist as soon as possible to save the tooth. Loose teeth may need to be wired or bonded with a plastic splint to hold them in place. A paste may be painted on the open area of the broken tooth to reduce the pain. Antibiotics and pain medicine may be prescribed. Choosing a soft or liquid diet and rinsing the mouth out with warm water after meals may be helpful. See your dentist as recommended. Failure to seek care or follow up with a dentist or other specialist as recommended could result in the loss of your tooth, infection, or permanent dental problems. SEEK MEDICAL CARE IF:   You have increased pain not controlled with medicines.  You have swelling around the tooth, in the face or neck.  You have bleeding which starts, continues, or gets worse.  You have a fever. Document Released: 09/02/2004 Document Revised: 10/18/2011 Document Reviewed:  06/17/2009 [facility] Patient Information 2015 [place], [place]. This information is not intended to replace advice given to you by your health care provider. Make sure you discuss any questions you have with your health care provider.  Low-cost dental clinic: [place]andy [facility]  at [phone]**  **Brandy Reed at [phone] [address]**    You may also call [phone]  Dental Assistance If the dentist on-call cannot see you, please use the resources below:   Patients with Medicaid: [facility] [phone] [place]andy Reed, [id number] [address], [phone]  If unable to pay, or uninsured, contact [facility] [phone]) or [facility]iatric Department [phone] in [place], [phone] in [facility]) to become qualified for the adult dental clinic  Other Low-Cost Community Dental Services: [facility]- [address] [place], [place], [address]    [phone], Ext. [phone]    2nd and 4th Thursday of the month at 6:30am    10 clients each day by appointment, can sometimes see walk-in     patients if someone does not show for an appointment [facility]- [address] [place], [place], [address]    [phone] [facility] [address] [place], [place], [address]    [phone]  [facility] Health Department- [phone] [facility] Department- [phone] [facility] Department- [phone]

## 2015-05-21 ENCOUNTER — Encounter (HOSPITAL_COMMUNITY): Payer: Self-pay | Admitting: Emergency Medicine

## 2015-05-21 ENCOUNTER — Emergency Department (HOSPITAL_COMMUNITY)
Admission: EM | Admit: 2015-05-21 | Discharge: 2015-05-21 | Disposition: A | Discharge status: Home / Self Care | Payer: Medicaid Other | Attending: Physician Assistant | Admitting: Physician Assistant

## 2015-05-21 DIAGNOSIS — Z792 Long term (current) use of antibiotics: Secondary | ICD-10-CM | POA: Diagnosis not present

## 2015-05-21 DIAGNOSIS — Z720 Tobacco use: Secondary | ICD-10-CM | POA: Insufficient documentation

## 2015-05-21 DIAGNOSIS — Z8709 Personal history of other diseases of the respiratory system: Secondary | ICD-10-CM | POA: Insufficient documentation

## 2015-05-21 DIAGNOSIS — K0889 Other specified disorders of teeth and supporting structures: Secondary | ICD-10-CM | POA: Diagnosis present

## 2015-05-21 DIAGNOSIS — K029 Dental caries, unspecified: Secondary | ICD-10-CM | POA: Insufficient documentation

## 2015-05-21 DIAGNOSIS — R61 Generalized hyperhidrosis: Secondary | ICD-10-CM | POA: Diagnosis not present

## 2015-05-21 DIAGNOSIS — Z8659 Personal history of other mental and behavioral disorders: Secondary | ICD-10-CM | POA: Diagnosis not present

## 2015-05-21 MED ORDER — AMOXICILLIN 500 MG PO CAPS
500.0000 mg | ORAL_CAPSULE | Freq: Once | ORAL | Status: AC
  Administered 2015-05-21: 500 mg via ORAL
  Filled 2015-05-21: qty 1

## 2015-05-21 MED ORDER — OXYCODONE-ACETAMINOPHEN 5-325 MG PO TABS
1.0000 | ORAL_TABLET | Freq: Once | ORAL | Status: AC
  Administered 2015-05-21: 1 via ORAL
  Filled 2015-05-21: qty 1

## 2015-05-21 MED ORDER — TRAMADOL HCL 50 MG PO TABS: 50.0000 mg | ORAL_TABLET | Freq: Four times a day (QID) | ORAL | Status: DC | PRN

## 2015-05-21 MED ORDER — AMOXICILLIN 500 MG PO CAPS: 500.0000 mg | ORAL_CAPSULE | Freq: Three times a day (TID) | ORAL | Status: DC

## 2015-05-21 NOTE — ED Notes (Signed)
Pt c/o right lower side dental pain 10/10 for 2 week getting worse since Sunday. Pt states this pain radiates to her head.

## 2015-05-21 NOTE — ED Provider Notes (Signed)
CSN: [id number]     Arrival date & time 05/21/15  2000 History  By signing my name below, I, [name], attest that this documentation has been prepared under the direction and in the presence of Kerrie BuffaloHope Lydia Meng, NP. Electronically Signed: [name], ED Scribe. 05/21/2015. 8:26 PM.  Chief Complaint  Patient presents with  . Dental Pain   Patient is a 34 y.o. female presenting with tooth pain. The history is provided by the patient. No language interpreter was used.  Dental Pain Location:  Lower Lower teeth location:  31/RL 2nd molar Quality:  Unable to specify Severity:  Severe Onset quality:  Gradual Duration:  4 days Timing:  Constant Progression:  Worsening Chronicity:  New Relieved by:  Nothing Worsened by:  Hot food/drink and cold food/drink Associated symptoms: fever and headaches      HPI Comments: Brandy Reed is a 34 y.o. female who presents to the Emergency Department complaining of gradual onset, constant, severe, right lower dental pain x 1.5 months, worse for the past 4 days. Pt states that she has [facility] pain. She was referred to a surgeon and is scheduled to have tooth pulled on 05/31/2015 (approximatley 1.5 weeks from now). Pt states she could not wait for her appointment because she is in so much pain. Pt also complains of a headache, fever of 102, chills, and night sweats. Pt was seen in the ED approximately 2 months ago for same symptoms and was given Rx for antibiotics. Pt finished the course of antibiotics without relief. She denies nausea, vomiting, chest pain, or any other associated symptoms.    Past Medical History  Diagnosis Date  . Depression     postpartum  . Sinus disorder     sometimes gets HA's  . [id number](478.2Headache(784.0)    Past Surgical History  Procedure Laterality Date  . Cesarean section  X 3  . Dilation and curettage of uterus    . Nose surgery    . Wisdom tooth extraction    .  Cesarean section N/A 07/07/2014    Procedure: CESAREAN SECTION;  Surgeon: Kathreen CosierBernard A Marshall, MD;  Location: [facility];  Service: Obstetrics;  Laterality: N/A;   No family history on file. Social History  Substance Use Topics  . Smoking status: Current Every Day Smoker -- 0.25 packs/day    Types: Cigars  . Smokeless tobacco: Never Used  . Alcohol Use: No   OB History    Gravida Para Term Preterm AB TAB SAB Ectopic Multiple Living   6 5 5  1  1   0 5     Review of Systems  Constitutional: Positive for fever, chills and diaphoresis.  HENT: Positive for dental problem.   Cardiovascular: Negative for chest pain.  Gastrointestinal: Negative for nausea and vomiting.  Neurological: Positive for headaches.  All other systems reviewed and are negative.  Allergies  Review of patient's allergies indicates no known allergies.  Home Medications   Prior to Admission medications   Medication Sig Start Date End Date Taking? Authorizing Provider  amoxicillin (AMOXIL) 500 MG capsule Take 1 capsule (500 mg total) by mouth 3 (three) times daily. 05/21/15   Jill Stopka Orlene OchM Dave Mannes, NP  traMADol (ULTRAM) 50 MG tablet Take 1 tablet (50 mg total) by mouth every 6 (six) hours as needed. 05/21/15   Indiya Izquierdo Orlene OchM Charlise Giovanetti, NP   Triage Vitals: BP 138/93 mmHg  Pulse 95  Temp(Src) 99.4 F (37.4 C) (Oral)  Resp 20  Ht  (1.6 m)  Wt 184 lb (83.462 kg)  BMI 32.60 kg/m2  SpO2 100%  LMP 05/11/2015   Physical Exam  Constitutional: She appears well-developed and well-nourished.  HENT:  Head: Normocephalic and atraumatic.  Mouth/Throat: Uvula is midline. Posterior oropharyngeal erythema present. No posterior oropharyngeal edema.  Right lower second molar is decayed at the gumline Gum surrounding the second molar is swollen with erythema Tender on exam  Eyes: Conjunctivae are normal.  Neck: Normal range of motion.  Small anterior cervical nodes  Cardiovascular: Normal rate, regular rhythm, normal heart sounds and intact  distal pulses.   Pulmonary/Chest: Effort normal and breath sounds normal. She has no wheezes.  Abdominal: Soft. Bowel sounds are normal. There is no tenderness. There is no CVA tenderness.  Musculoskeletal: Normal range of motion.  Neurological: She is alert.  Skin: Skin is warm and dry.  Psychiatric: She has a normal mood and affect.  Nursing note and vitals reviewed.   ED Course  Procedures (including critical care time)  DIAGNOSTIC STUDIES: Oxygen Saturation is 100% on RA, normal by my interpretation.    COORDINATION OF CARE: 8:22 PM-Discussed treatment plan which includes antibiotics with pt at bedside and pt agreed to plan.    MDM  34 y.o. female with dental pain due to caries, afebrile and does not appear toxic. Will treat for pain and infection and she will see her dentist as scheduled. Discussed with the patient and all questioned fully answered.    Final diagnoses:  Pain due to dental caries    I personally performed the services described in this documentation, which was scribed in my presence. The recorded information has been reviewed and is accurate.      [facility] Orlene Och, NP 05/23/15 1433  Courteney Randall An, MD 05/24/15 [phone]

## 2015-05-21 NOTE — Discharge Instructions (Signed)
Dental Abscess °A dental abscess is a collection of pus in or around a tooth. °CAUSES °This condition is caused by a bacterial infection around the root of the tooth that involves the inner part of the tooth (pulp). It may result from: °· Severe tooth decay. °· Trauma to the tooth that allows bacteria to enter into the pulp, such as a broken or chipped tooth. °· Severe gum disease around a tooth. °SYMPTOMS °Symptoms of this condition include: °· Severe pain in and around the infected tooth. °· Swelling and redness around the infected tooth, in the mouth, or in the face. °· Tenderness. °· Pus drainage. °· Bad breath. °· Bitter taste in the mouth. °· Difficulty swallowing. °· Difficulty opening the mouth. °· Nausea. °· Vomiting. °· Chills. °· Swollen neck glands. °· Fever. °DIAGNOSIS °This condition is diagnosed with examination of the infected tooth. During the exam, your dentist may tap on the infected tooth. Your dentist will also ask about your medical and dental history and may order X-rays. °TREATMENT °This condition is treated by eliminating the infection. This may be done with: °· Antibiotic medicine. °· A root canal. This may be performed to save the tooth. °· Pulling (extracting) the tooth. This may also involve draining the abscess. This is done if the tooth cannot be saved. °HOME CARE INSTRUCTIONS °· Take medicines only as directed by your dentist. °· If you were prescribed antibiotic medicine, finish all of it even if you start to feel better. °· Rinse your mouth (gargle) often with salt water to relieve pain or swelling. °· Do not drive or operate heavy machinery while taking pain medicine. °· Do not apply heat to the outside of your mouth. °· Keep all follow-up visits as directed by your dentist. This is important. °SEEK MEDICAL CARE IF: °· Your pain is worse and is not helped by medicine. °SEEK IMMEDIATE MEDICAL CARE IF: °· You have a fever or chills. °· Your symptoms suddenly get worse. °· You have a  very bad headache. °· You have problems breathing or swallowing. °· You have trouble opening your mouth. °· You have swelling in your neck or around your eye. °  °This information is not intended to replace advice given to you by your health care provider. Make sure you discuss any questions you have with your health care provider. °  °Document Released: 07/26/2005 Document Revised: 12/10/2014 Document Reviewed: 07/23/2014 °Elsevier Interactive Patient Education ©2016 [employer] ° °Dental Caries °Dental caries (also called tooth decay) is the most common oral disease. It can occur at any age but is more common in children and young adults.  °HOW DENTAL CARIES DEVELOPS  °The process of decay begins when bacteria and foods (particularly sugars and starches) combine in your mouth to produce plaque. Plaque is a substance that sticks to the hard, outer surface of a tooth (enamel). The bacteria in plaque produce acids that attack enamel. These acids may also attack the root surface of a tooth (cementum) if it is exposed. Repeated attacks dissolve these surfaces and create holes in the tooth (cavities). If left untreated, the acids destroy the other layers of the tooth.  °RISK FACTORS °· Frequent sipping of sugary beverages.   °· Frequent snacking on sugary and starchy foods, especially those that easily get stuck in the teeth.   °· Poor oral hygiene.   °· Dry mouth.   °· Substance abuse such as methamphetamine abuse.   °· Broken or poor-fitting dental restorations.   °· Eating disorders.   °· Gastroesophageal reflux disease (  GERD).   °· Certain radiation treatments to the head and neck. °SYMPTOMS °In the early stages of dental caries, symptoms are seldom present. Sometimes white, chalky areas may be seen on the enamel or other tooth layers. In later stages, symptoms may include: °· Pits and holes on the enamel. °· Toothache after sweet, hot, or cold foods or drinks are consumed. °· Pain around the tooth. °· Swelling  around the tooth. °DIAGNOSIS  °Most of the time, dental caries is detected during a regular dental checkup. A diagnosis is made after a thorough medical and dental history is taken and the surfaces of your teeth are checked for signs of dental caries. Sometimes special instruments, such as lasers, are used to check for dental caries. Dental X-ray exams may be taken so that areas not visible to the eye (such as between the contact areas of the teeth) can be checked for cavities.  °TREATMENT  °If dental caries is in its early stages, it may be reversed with a fluoride treatment or an application of a remineralizing agent at the dental office. Thorough brushing and flossing at home is needed to aid these treatments. If it is in its later stages, treatment depends on the location and extent of tooth destruction:  °· If a small area of the tooth has been destroyed, the destroyed area will be removed and cavities will be filled with a material such as gold, silver amalgam, or composite resin.   °· If a large area of the tooth has been destroyed, the destroyed area will be removed and a cap (crown) will be fitted over the remaining tooth structure.   °· If the center part of the tooth (pulp) is affected, a procedure called a root canal will be needed before a filling or crown can be placed.   °· If most of the tooth has been destroyed, the tooth may need to be pulled (extracted). °HOME CARE INSTRUCTIONS °You can prevent, stop, or reverse dental caries at home by practicing good oral hygiene. Good oral hygiene includes: °· Thoroughly cleaning your teeth at least twice a day with a toothbrush and dental floss.   °· Using a fluoride toothpaste. A fluoride mouth rinse may also be used if recommended by your dentist or health care provider.   °· Restricting the amount of sugary and starchy foods and sugary liquids you consume.   °· Avoiding frequent snacking on these foods and sipping of these liquids.   °· Keeping regular  visits with a dentist for checkups and cleanings. °PREVENTION  °· Practice good oral hygiene. °· Consider a dental sealant. A dental sealant is a coating material that is applied by your dentist to the pits and grooves of teeth. The sealant prevents food from being trapped in them. It may protect the teeth for several years. °· Ask about fluoride supplements if you live in a community without fluorinated water or with water that has a low fluoride content. Use fluoride supplements as directed by your dentist or health care provider. °· Allow fluoride varnish applications to teeth if directed by your dentist or health care provider. °  °This information is not intended to replace advice given to you by your health care provider. Make sure you discuss any questions you have with your health care provider. °  °Document Released: 04/17/2002 Document Revised: 08/16/2014 Document Reviewed: 07/28/2012 °Elsevier Interactive Patient Education ©2016 [employer]vier Inc. ° °

## 2015-05-22 ENCOUNTER — Encounter (HOSPITAL_COMMUNITY): Payer: Self-pay | Admitting: Emergency Medicine

## 2015-05-22 ENCOUNTER — Emergency Department (HOSPITAL_COMMUNITY)
Admission: EM | Admit: 2015-05-22 | Discharge: 2015-05-22 | Disposition: A | Discharge status: Home / Self Care | Payer: Medicaid Other | Attending: Emergency Medicine | Admitting: Emergency Medicine

## 2015-05-22 DIAGNOSIS — Z720 Tobacco use: Secondary | ICD-10-CM | POA: Diagnosis not present

## 2015-05-22 DIAGNOSIS — K0889 Other specified disorders of teeth and supporting structures: Secondary | ICD-10-CM

## 2015-05-22 DIAGNOSIS — F329 Major depressive disorder, single episode, unspecified: Secondary | ICD-10-CM | POA: Diagnosis not present

## 2015-05-22 NOTE — Discharge Instructions (Signed)

## 2015-05-22 NOTE — ED Notes (Signed)
Pt seen yesterday for same. Pt with bottom R dental abscess. Pt sts prescription pain medication is not working and she can not tolerate the pain.

## 2015-05-22 NOTE — ED Provider Notes (Signed)
CSN: [id number]     Arrival date & time 05/22/15  1425 History  By signing my name below, I, Brandy Reed, attest that this documentation has been prepared under the direction and in the presence of Brandy Horsemanobert Siniyah Evangelist, PA-C. Electronically Signed: Tanda RockersMargaux Reed, ED Scribe. 05/22/2015. 3:25 PM.  Chief Complaint  Patient presents with  . Dental Pain   The history is provided by the patient. No language interpreter was used.     HPI Comments: Brandy Reed is a 34 y.o. female who presents to the Emergency Department complaining of gradual onset, constant, severe, right bottom dental pain x 1.5 months, worsening the past 5 days. Pt was seen in the ED yesterday for same and was given prescription for Ultram and Amoxicillin. She states that the pain medication is not working. [facility] (1 week from now). She tried to get into contact with them more than once this morning but no one answered. She also left a voicemail but hasn't heard back. Denies fever, chills, or any other associated symptoms.    Past Medical History  Diagnosis Date  . Depression     postpartum  . Sinus disorder     sometimes gets HA's  . JYNWGNFA(213.0Headache(784.0)    Past Surgical History  Procedure Laterality Date  . Cesarean section  X 3  . Dilation and curettage of uterus    . Nose surgery    . Wisdom tooth extraction    . Cesarean section N/A 07/07/2014    Procedure: CESAREAN SECTION;  Surgeon: Kathreen CosierBernard A Marshall, MD;  Location: [facility] ORS;  Service: Obstetrics;  Laterality: N/A;   No family history on file. Social History  Substance Use Topics  . Smoking status: Current Every Day Smoker -- 0.25 packs/day    Types: Cigars  . Smokeless tobacco: Never Used  . Alcohol Use: No   OB History    Gravida Para Term Preterm AB TAB SAB Ectopic Multiple Living   6 5 5  1  1   0 5     Review of Systems  Constitutional: Negative for fever and chills.  HENT:  Positive for dental problem.   All other systems reviewed and are negative.     Allergies  Review of patient's allergies indicates no known allergies.  Home Medications   Prior to Admission medications   Medication Sig Start Date End Date Taking? Authorizing Provider  amoxicillin (AMOXIL) 500 MG capsule Take 1 capsule (500 mg total) by mouth 3 (three) times daily. 05/21/15   Hope Orlene OchM Neese, NP  traMADol (ULTRAM) 50 MG tablet Take 1 tablet (50 mg total) by mouth every 6 (six) hours as needed. 05/21/15   Hope Orlene OchM Neese, NP   Triage Vitals: BP 147/91 mmHg  Pulse 67  Temp(Src) 98.7 F (37.1 C) (Oral)  Resp 14  SpO2 99%  LMP 05/11/2015   Physical Exam  Constitutional: She is oriented to person, place, and time. She appears well-developed and well-nourished. No distress.  HENT:  Head: Normocephalic and atraumatic.  Mouth/Throat:    Poor dentition throughout.  Affected tooth as diagrammed.  No signs of peritonsillar or tonsillar abscess.  No signs of gingival abscess. Oropharynx is clear and without exudates.  Uvula is midline.  Airway is intact. No signs of Ludwig's angina with palpation of oral and sublingual mucosa.   Eyes: Conjunctivae and EOM are normal.  Neck: Normal range of motion. Neck supple. No tracheal deviation present.  Cardiovascular: Normal rate.   Pulmonary/Chest: Effort normal. No respiratory distress.  Abdominal: She exhibits no distension.  Musculoskeletal: Normal range of motion.  Neurological: She is alert and oriented to person, place, and time.  Skin: Skin is warm and dry.  Psychiatric: She has a normal mood and affect. Her behavior is normal. Judgment and thought content normal.  Nursing note and vitals reviewed.   ED Course  Procedures (including critical care time)  DIAGNOSTIC STUDIES: Oxygen Saturation is 99% on RA, normal by my interpretation.    COORDINATION OF CARE: 3:25 PM-Discussed treatment plan which includes continuing taking antibiotics  with pt at bedside and pt agreed to plan.     MDM   Final diagnoses:  Pain, dental   Patient with toothache.  No gross abscess.  Exam unconcerning for Ludwig's angina or spread of infection.  Will continue treatment with amoxicillin prescribed yesterday and OTC pain medicine.  Urged patient to follow-up with dentist.    I, Brandy Reed, personally performed the services described in this documentation. All medical record entries made by the scribe were at my direction and in my presence.  I have reviewed the chart and discharge instructions and agree that the record reflects my personal performance and is accurate and complete. Santos Hardwick.  05/22/2015. 3:27 PM.        Brandy Horseman, PA-C 05/22/15 1528  Elwin Mocha, MD 05/22/15 1544

## 2015-05-22 NOTE — ED Notes (Signed)
Pt left prior to receiving paperwork  

## 2015-06-01 ENCOUNTER — Encounter (HOSPITAL_COMMUNITY): Payer: Self-pay | Admitting: Oncology

## 2015-06-01 ENCOUNTER — Emergency Department (HOSPITAL_COMMUNITY)
Admission: EM | Admit: 2015-06-01 | Discharge: 2015-06-01 | Disposition: A | Discharge status: Home / Self Care | Payer: Medicaid Other | Attending: Emergency Medicine | Admitting: Emergency Medicine

## 2015-06-01 DIAGNOSIS — Z8709 Personal history of other diseases of the respiratory system: Secondary | ICD-10-CM | POA: Diagnosis not present

## 2015-06-01 DIAGNOSIS — K029 Dental caries, unspecified: Secondary | ICD-10-CM | POA: Diagnosis not present

## 2015-06-01 DIAGNOSIS — F329 Major depressive disorder, single episode, unspecified: Secondary | ICD-10-CM | POA: Diagnosis not present

## 2015-06-01 DIAGNOSIS — Z720 Tobacco use: Secondary | ICD-10-CM | POA: Diagnosis not present

## 2015-06-01 DIAGNOSIS — K0889 Other specified disorders of teeth and supporting structures: Secondary | ICD-10-CM | POA: Insufficient documentation

## 2015-06-01 MED ORDER — IBUPROFEN 600 MG PO TABS: 600.0000 mg | ORAL_TABLET | Freq: Four times a day (QID) | ORAL | Status: DC | PRN

## 2015-06-01 MED ORDER — AMOXICILLIN 500 MG PO CAPS: 500.0000 mg | ORAL_CAPSULE | Freq: Three times a day (TID) | ORAL | Status: DC

## 2015-06-01 MED ORDER — BUPIVACAINE-EPINEPHRINE (PF) 0.5% -1:200000 IJ SOLN
1.8000 mL | Freq: Once | INTRAMUSCULAR | Status: AC
  Administered 2015-06-01: 1.8 mL
  Filled 2015-06-01: qty 1.8

## 2015-06-01 NOTE — Discharge Instructions (Signed)

## 2015-06-01 NOTE — ED Notes (Signed)
Bed: WA19 Expected date:  Expected time:  Means of arrival:  Comments: 

## 2015-06-01 NOTE — ED Notes (Signed)
Pt presents d/t ongoing right sided dental pain.  Pt was supposed to have tooth pulled yesterday however was unable to because she is scared of needles and needs a dentist that can sedate her for the procedure. Pt rates pain 10/10.

## 2015-06-01 NOTE — ED Provider Notes (Signed)
CSN: [id number]     Arrival date & time 06/01/15  0100 History   First MD Initiated Contact with Patient 06/01/15 0105     Chief Complaint  Patient presents with  . Dental Pain     (Consider location/radiation/quality/duration/timing/severity/associated sxs/prior Treatment) Patient is a 34 y.o. female presenting with tooth pain. The history is provided by the patient. No language interpreter was used.  Dental Pain Location:  Lower Lower teeth location:  31/RL 2nd molar Quality:  Throbbing and aching Severity:  Moderate Onset quality:  Gradual Duration:  1 month Timing:  Intermittent Progression:  Worsening Chronicity:  Recurrent Context: dental caries and poor dentition   Context: not trauma   Previous work-up:  Dental exam Relieved by: Percocet prescribed by dentist. Worsened by:  Touching and pressure Ineffective treatments:  NSAIDs Associated symptoms: facial pain and headaches   Associated symptoms: no difficulty swallowing, no drooling, no fever, no oral bleeding and no trismus   Risk factors: lack of dental care and smoking     Past Medical History  Diagnosis Date  . Depression     postpartum  . Sinus disorder     sometimes gets HA's  . WUJWJXBJ(478.2Headache(784.0)    Past Surgical History  Procedure Laterality Date  . Cesarean section  X 3  . Dilation and curettage of uterus    . Nose surgery    . Wisdom tooth extraction    . Cesarean section N/A 07/07/2014    Procedure: CESAREAN SECTION;  Surgeon: Kathreen CosierBernard A Marshall, MD;  Location: [facility];  Service: Obstetrics;  Laterality: N/A;   No family history on file. Social History  Substance Use Topics  . Smoking status: Current Every Day Smoker -- 0.25 packs/day    Types: Cigars  . Smokeless tobacco: Never Used  . Alcohol Use: No   OB History    Gravida Para Term Preterm AB TAB SAB Ectopic Multiple Living   6 5 5  1  1   0 5      Review of Systems  Constitutional: Negative for fever.  HENT: Positive for dental  problem. Negative for drooling.   Neurological: Positive for headaches.  All other systems reviewed and are negative.   Allergies  Review of patient's allergies indicates no known allergies.  Home Medications   Prior to Admission medications   Medication Sig Start Date End Date Taking? Authorizing Provider  traMADol (ULTRAM) 50 MG tablet Take 1 tablet (50 mg total) by mouth every 6 (six) hours as needed. 05/21/15  Yes Hope Orlene OchM Neese, NP  amoxicillin (AMOXIL) 500 MG capsule Take 1 capsule (500 mg total) by mouth 3 (three) times daily. 06/01/15   Antony MaduraKelly Lawrance Wiedemann, PA-C  ibuprofen (ADVIL,MOTRIN) 600 MG tablet Take 1 tablet (600 mg total) by mouth every 6 (six) hours as needed. 06/01/15   Antony MaduraKelly Nijah Tejera, PA-C   BP 146/90 mmHg  Pulse 79  Temp(Src) 97.9 F (36.6 C) (Oral)  Ht 5\' 4"  (1.626 m)  Wt 157 lb (71.215 kg)  BMI 26.94 kg/m2  SpO2 100%  LMP 05/28/2015 (Approximate)   Physical Exam  Constitutional: She is oriented to person, place, and time. She appears well-developed and well-nourished. No distress.  Nontoxic/nonseptic appearing  HENT:  Head: Normocephalic and atraumatic.  Mouth/Throat: Uvula is midline, oropharynx is clear and moist and mucous membranes are normal. Dental caries present. No dental abscesses.    No gingival swelling or fluctuance. No purulence or oral bleeding. No trismus. Patient tolerating secretions without difficulty.  Eyes: Conjunctivae  and EOM are normal. No scleral icterus.  Neck: Normal range of motion.  No nuchal rigidity or meningismus  Pulmonary/Chest: Effort normal. No respiratory distress.  Musculoskeletal: Normal range of motion.  Neurological: She is alert and oriented to person, place, and time. She exhibits normal muscle tone. Coordination normal.  Skin: Skin is warm and dry. No rash noted. She is not diaphoretic. No erythema. No pallor.  Psychiatric: She has a normal mood and affect. Her behavior is normal.  Nursing note and vitals reviewed.   ED  Course  Procedures (including critical care time) Labs Review Labs Reviewed - No data to display  Imaging Review No results found. I have personally reviewed and evaluated these images and lab results as part of my medical decision-making.   EKG Interpretation None      MDM   Final diagnoses:  Dentalgia    Patient with toothache. No gross abscess. Exam unconcerning for Ludwig's angina or spread of infection. Will treat with amoxicillin and NSAIDs. Dental block offered and ordered, but patient later declined this over fear of needles. Urged patient to follow-up with dentist. Return precautions given at discharge. Patient discharged in good condition with no unaddressed concerns.   Filed Vitals:   06/01/15 0111 06/01/15 0112  BP:  146/90  Pulse: 79   Temp: 97.9 F (36.6 C)   TempSrc: Oral   Height:  (1.626 m)   Weight: 157 lb (71.215 kg)   SpO2: 100%        Antony Madura, PA-C 06/01/15 0231  Shon Baton, MD 06/01/15 [phone]

## 2015-11-19 LAB — OB RESULTS CONSOLE GC/CHLAMYDIA
CHLAMYDIA, DNA PROBE: NEGATIVE
GC PROBE AMP, GENITAL: NEGATIVE

## 2015-11-19 LAB — OB RESULTS CONSOLE HGB/HCT, BLOOD
HCT: 32 %
Hemoglobin: 10.7 g/dL

## 2015-11-19 LAB — OB RESULTS CONSOLE RUBELLA ANTIBODY, IGM: Rubella: IMMUNE

## 2015-11-19 LAB — OB RESULTS CONSOLE PLATELET COUNT: Platelets: 426 10*3/uL

## 2015-11-19 LAB — OB RESULTS CONSOLE RPR: RPR: NONREACTIVE

## 2015-12-05 ENCOUNTER — Encounter: Payer: Self-pay | Admitting: Obstetrics

## 2015-12-05 ENCOUNTER — Ambulatory Visit (INDEPENDENT_AMBULATORY_CARE_PROVIDER_SITE_OTHER): Payer: Medicaid Other | Admitting: Obstetrics

## 2015-12-05 VITALS — BP 110/73 | HR 84 | Temp 99.1°F | Wt 186.8 lb

## 2015-12-05 DIAGNOSIS — K219 Gastro-esophageal reflux disease without esophagitis: Secondary | ICD-10-CM

## 2015-12-05 DIAGNOSIS — O09522 Supervision of elderly multigravida, second trimester: Secondary | ICD-10-CM | POA: Diagnosis not present

## 2015-12-05 DIAGNOSIS — Z3492 Encounter for supervision of normal pregnancy, unspecified, second trimester: Secondary | ICD-10-CM

## 2015-12-05 DIAGNOSIS — O219 Vomiting of pregnancy, unspecified: Secondary | ICD-10-CM | POA: Diagnosis not present

## 2015-12-05 LAB — POCT URINALYSIS DIPSTICK
Bilirubin, UA: NEGATIVE
Blood, UA: 250
Glucose, UA: NEGATIVE
KETONES UA: NEGATIVE
Nitrite, UA: NEGATIVE
PH UA: 6
Spec Grav, UA: 1.015
UROBILINOGEN UA: NEGATIVE

## 2015-12-05 MED ORDER — OMEPRAZOLE 20 MG PO CPDR: 20.0000 mg | DELAYED_RELEASE_CAPSULE | Freq: Two times a day (BID) | ORAL | Status: DC

## 2015-12-05 MED ORDER — PRENATE MINI 29-0.6-0.4-350 MG PO CAPS: 1.0000 | ORAL_CAPSULE | Freq: Every day | ORAL | Status: DC

## 2015-12-05 MED ORDER — METOCLOPRAMIDE HCL 10 MG PO TABS: 10.0000 mg | ORAL_TABLET | Freq: Three times a day (TID) | ORAL | Status: DC | PRN

## 2015-12-05 NOTE — Progress Notes (Signed)
Subjective:    Brandy Reed is being seen today for her first obstetrical visit.  This is not a planned pregnancy. She is at [redacted]w[redacted]d gestation. Her obstetrical history is significant for advanced maternal age and smoker. Relationship with FOB: significant other, not living together. Patient does intend to breast feed. Pregnancy history fully reviewed.  The information documented in the HPI was reviewed and verified.  Menstrual History: OB History    Gravida Para Term Preterm AB TAB SAB Ectopic Multiple Living   0 5       Patient's last menstrual period was 05/28/2015 (approximate).    Past Medical History  Diagnosis Date  . Depression     postpartum  . Sinus disorder     sometimes gets HA's  . NWGNFAOZ(308.6)     Past Surgical History  Procedure Laterality Date  . Cesarean section  X 3  . Dilation and curettage of uterus    . Nose surgery    . Wisdom tooth extraction    . Cesarean section N/A 07/07/2014    Procedure: CESAREAN SECTION;  Surgeon: Kathreen Cosier, MD;  Location: [facility];  Service: Obstetrics;  Laterality: N/A;     (Not in a hospital admission) No Known Allergies  Social History  Substance Use Topics  . Smoking status: Current Every Day Smoker -- 0.25 packs/day    Types: Cigars  . Smokeless tobacco: Never Used  . Alcohol Use: No    History reviewed. No pertinent family history.   Review of Systems Constitutional: negative for weight loss Gastrointestinal: negative for vomiting Genitourinary:negative for genital lesions and vaginal discharge and dysuria Musculoskeletal:negative for back pain Behavioral/Psych: negative for abusive relationship, depression, illegal drug usage and tobacco use    Objective:    BP 110/73 mmHg  Pulse 84  Temp(Src) 99.1 F (37.3 C)  Wt 186 lb 12.8 oz (84.732 kg)  LMP 05/28/2015 (Approximate) General Appearance:    Alert, cooperative, no distress, appears stated age  Head:    Normocephalic, without  obvious abnormality, atraumatic  Eyes:    PERRL, conjunctiva/corneas clear, EOM's intact, fundi    benign, both eyes  Ears:    Normal TM's and external ear canals, both ears  Nose:   Nares normal, septum midline, mucosa normal, no drainage    or sinus tenderness  Throat:   Lips, mucosa, and tongue normal; teeth and gums normal  Neck:   Supple, symmetrical, trachea midline, no adenopathy;    thyroid:  no enlargement/tenderness/nodules; no carotid   bruit or JVD  Back:     Symmetric, no curvature, ROM normal, no CVA tenderness  Lungs:     Clear to auscultation bilaterally, respirations unlabored  Chest Wall:    No tenderness or deformity   Heart:    Regular rate and rhythm, S1 and S2 normal, no murmur, rub   or gallop  Breast Exam:    No tenderness, masses, or nipple abnormality  Abdomen:     Soft, non-tender, bowel sounds active all four quadrants,    no masses, no organomegaly  Genitalia:    Normal female without lesion, discharge or tenderness  Extremities:   Extremities normal, atraumatic, no cyanosis or edema  Pulses:   2+ and symmetric all extremities  Skin:   Skin color, texture, turgor normal, no rashes or lesions  Lymph nodes:   Cervical, supraclavicular, and axillary nodes normal  Neurologic:   CNII-XII intact, normal strength, sensation and reflexes  throughout      Lab Review Urine pregnancy test Labs reviewed yes Radiologic studies reviewed no  Assessment:    Pregnancy at 4637w6d weeks    AMA   Plan:      Referred to MFM Ultrasound ordered Prenatal vitamins.  Counseling provided regarding continued use of seat belts, cessation of alcohol consumption, smoking or use of illicit drugs; infection precautions i.e., influenza/TDAP immunizations, toxoplasmosis,CMV, parvovirus, listeria and varicella; workplace safety, exercise during pregnancy; routine dental care, safe medications, sexual activity, hot tubs, saunas, pools, travel, caffeine use, fish and methlymercury,  potential toxins, hair treatments, varicose veins Weight gain recommendations per IOM guidelines reviewed: underweight/BMI< 18.5--> gain 28 - 40 lbs; normal weight/BMI 18.5 - 24.9--> gain 25 - 35 lbs; overweight/BMI 25 - 29.9--> gain 15 - 25 lbs; obese/BMI >30->gain  11 - 20 lbs Problem list reviewed and updated. FIRST/CF mutation testing/NIPT/QUAD SCREEN/fragile X/Ashkenazi Jewish population testing/Spinal muscular atrophy discussed: requested. Role of ultrasound in pregnancy discussed; fetal survey: requested. Amniocentesis discussed: declined. VBAC calculator score: VBAC consent form provided Meds ordered this encounter  Medications  . prenatal vitamin w/FE, FA (PRENATAL 1 + 1) 27-1 MG TABS tablet    Sig: Take 1 tablet by mouth daily at 12 noon.  . diphenhydrAMINE (BENADRYL) 25 MG tablet    Sig: Take 25 mg by mouth every 6 (six) hours as needed.  . Prenat w/o A-FeCbn-Meth-FA-DHA (PRENATE MINI) 29-0.6-0.4-350 MG CAPS    Sig: Take 1 capsule by mouth daily before breakfast.    Dispense:  90 capsule    Refill:  3  . metoCLOPramide (REGLAN) 10 MG tablet    Sig: Take 1 tablet (10 mg total) by mouth every 8 (eight) hours as needed for nausea.    Dispense:  90 tablet    Refill:  5  . omeprazole (PRILOSEC) 20 MG capsule    Sig: Take 1 capsule (20 mg total) by mouth 2 (two) times daily before a meal.    Dispense:  60 capsule    Refill:  5   Orders Placed This Encounter  Procedures  . Urine culture  . US OB Comp + 14 Wk    Standing Status: Future     Number of Occurrences:      Standing Expiration Date: 02/03/2017    Order Specific Question:  Reason for Exam (SYMPTOM  OR DIAGNOSIS REQUIRED)    Answer:  anatomy    Order Specific Question:  Preferred imaging location?    Answer:  Internal  . POCT urinalysis dipstick    Follow up in 4 weeks.

## 2015-12-05 NOTE — Progress Notes (Signed)
Patient needs to have her anatomy US scheduled.

## 2015-12-07 ENCOUNTER — Other Ambulatory Visit: Payer: Self-pay | Admitting: Obstetrics

## 2015-12-07 DIAGNOSIS — O2342 Unspecified infection of urinary tract in pregnancy, second trimester: Secondary | ICD-10-CM

## 2015-12-07 LAB — URINE CULTURE

## 2015-12-07 MED ORDER — NITROFURANTOIN MONOHYD MACRO 100 MG PO CAPS: 100.0000 mg | ORAL_CAPSULE | Freq: Two times a day (BID) | ORAL | Status: DC

## 2015-12-08 ENCOUNTER — Other Ambulatory Visit: Payer: Self-pay | Admitting: *Deleted

## 2015-12-08 ENCOUNTER — Telehealth: Payer: Self-pay | Admitting: *Deleted

## 2015-12-08 DIAGNOSIS — O09522 Supervision of elderly multigravida, second trimester: Secondary | ICD-10-CM

## 2015-12-08 NOTE — Telephone Encounter (Signed)
Patient has questions about why she is scheduled for her US ay [facility] and not here at the office.  3:35 Explained that because of her age - she now falls into a high risk category and she will get genetic council ing when she goes for her appointment- she voices understanding.

## 2015-12-09 ENCOUNTER — Ambulatory Visit (HOSPITAL_COMMUNITY): Payer: Medicaid Other

## 2015-12-10 ENCOUNTER — Encounter: Payer: Self-pay | Admitting: *Deleted

## 2015-12-18 ENCOUNTER — Ambulatory Visit (HOSPITAL_COMMUNITY): Payer: Medicaid Other

## 2015-12-18 ENCOUNTER — Other Ambulatory Visit: Payer: Self-pay | Admitting: Obstetrics

## 2015-12-18 ENCOUNTER — Other Ambulatory Visit: Payer: Medicaid Other

## 2015-12-18 ENCOUNTER — Encounter: Payer: Medicaid Other | Admitting: Obstetrics

## 2015-12-18 ENCOUNTER — Ambulatory Visit (HOSPITAL_COMMUNITY): Admission: RE | Admit: 2015-12-18 | Payer: Medicaid Other | Source: Ambulatory Visit

## 2015-12-18 ENCOUNTER — Encounter (HOSPITAL_COMMUNITY): Payer: Self-pay

## 2015-12-18 ENCOUNTER — Ambulatory Visit (HOSPITAL_COMMUNITY)
Admission: RE | Admit: 2015-12-18 | Discharge: 2015-12-18 | Disposition: A | Discharge status: Home / Self Care | Payer: Medicaid Other | Source: Ambulatory Visit | Attending: Obstetrics | Admitting: Obstetrics

## 2015-12-18 DIAGNOSIS — Z36 Encounter for antenatal screening of mother: Secondary | ICD-10-CM | POA: Diagnosis not present

## 2015-12-18 DIAGNOSIS — Z3A24 24 weeks gestation of pregnancy: Secondary | ICD-10-CM | POA: Diagnosis not present

## 2015-12-18 DIAGNOSIS — O09522 Supervision of elderly multigravida, second trimester: Secondary | ICD-10-CM

## 2015-12-18 DIAGNOSIS — Z1389 Encounter for screening for other disorder: Secondary | ICD-10-CM

## 2015-12-22 ENCOUNTER — Ambulatory Visit (INDEPENDENT_AMBULATORY_CARE_PROVIDER_SITE_OTHER): Payer: Medicaid Other | Admitting: Obstetrics

## 2015-12-22 ENCOUNTER — Encounter: Payer: Medicaid Other | Admitting: Obstetrics

## 2015-12-22 VITALS — BP 115/73 | HR 83 | Temp 98.7°F | Wt 192.0 lb

## 2015-12-22 DIAGNOSIS — Z3492 Encounter for supervision of normal pregnancy, unspecified, second trimester: Secondary | ICD-10-CM

## 2015-12-22 DIAGNOSIS — G4700 Insomnia, unspecified: Secondary | ICD-10-CM

## 2015-12-22 MED ORDER — ZOLPIDEM TARTRATE 5 MG PO TABS: 5.0000 mg | ORAL_TABLET | Freq: Every evening | ORAL | Status: DC | PRN

## 2015-12-23 ENCOUNTER — Encounter: Payer: Self-pay | Admitting: Obstetrics

## 2015-12-23 NOTE — Progress Notes (Signed)
Subjective:    Haze RushingJoesanna D Ratto is a 35 y.o. female being seen today for her obstetrical visit. She is at 5453w3d gestation. Patient reports: no complaints . Fetal movement: normal.  Problem List Items Addressed This Visit    None    Visit Diagnoses    Prenatal care in second trimester    -  Primary    Relevant Orders    POCT Urinalysis Dipstick    Insomnia        Relevant Medications    zolpidem (AMBIEN) 5 MG tablet      Patient Active Problem List   Diagnosis Date Noted  . [redacted] weeks gestation of pregnancy   . Non-reactive NST (non-stress test)   . Vaginal bleeding in pregnancy   . S/P cesarean section 07/07/2014   Objective:    BP 115/73 mmHg  Pulse 83  Temp(Src) 98.7 F (37.1 C)  Wt 192 lb (87.091 kg)  LMP 05/28/2015 (Approximate) FHT: 150 BPM  Uterine Size: size equals dates     Assessment:    Pregnancy @ 2653w3d    Plan:    OBGCT: ordered for next visit. Signs and symptoms of preterm labor: discussed.  Labs, problem list reviewed and updated 2 hr GTT planned Follow up in 3 weeks.

## 2016-01-12 ENCOUNTER — Encounter: Payer: Medicaid Other | Admitting: Obstetrics

## 2016-01-15 ENCOUNTER — Ambulatory Visit (HOSPITAL_COMMUNITY): Admission: RE | Admit: 2016-01-15 | Payer: Medicaid Other | Source: Ambulatory Visit

## 2016-01-20 ENCOUNTER — Encounter (HOSPITAL_COMMUNITY): Payer: Medicaid Other

## 2016-01-26 ENCOUNTER — Other Ambulatory Visit: Payer: Self-pay | Admitting: *Deleted

## 2016-01-26 DIAGNOSIS — Z3493 Encounter for supervision of normal pregnancy, unspecified, third trimester: Secondary | ICD-10-CM

## 2016-01-26 DIAGNOSIS — O09523 Supervision of elderly multigravida, third trimester: Secondary | ICD-10-CM

## 2016-01-27 ENCOUNTER — Encounter (HOSPITAL_COMMUNITY): Payer: Self-pay

## 2016-01-27 ENCOUNTER — Ambulatory Visit (HOSPITAL_COMMUNITY): Admission: RE | Admit: 2016-01-27 | Payer: Medicaid Other | Source: Ambulatory Visit

## 2016-01-27 ENCOUNTER — Other Ambulatory Visit: Payer: Self-pay | Admitting: Obstetrics

## 2016-01-27 ENCOUNTER — Ambulatory Visit (HOSPITAL_COMMUNITY)
Admission: RE | Admit: 2016-01-27 | Discharge: 2016-01-27 | Disposition: A | Discharge status: Home / Self Care | Payer: Medicaid Other | Source: Ambulatory Visit | Attending: Obstetrics | Admitting: Obstetrics

## 2016-01-27 DIAGNOSIS — Z3493 Encounter for supervision of normal pregnancy, unspecified, third trimester: Secondary | ICD-10-CM

## 2016-01-27 DIAGNOSIS — Z3A30 30 weeks gestation of pregnancy: Secondary | ICD-10-CM | POA: Diagnosis not present

## 2016-01-27 DIAGNOSIS — Z36 Encounter for antenatal screening of mother: Secondary | ICD-10-CM | POA: Diagnosis not present

## 2016-01-27 DIAGNOSIS — O09523 Supervision of elderly multigravida, third trimester: Secondary | ICD-10-CM | POA: Diagnosis not present

## 2016-01-27 DIAGNOSIS — O4443 Low lying placenta NOS or without hemorrhage, third trimester: Secondary | ICD-10-CM | POA: Diagnosis not present

## 2016-03-03 ENCOUNTER — Ambulatory Visit (INDEPENDENT_AMBULATORY_CARE_PROVIDER_SITE_OTHER): Payer: Medicaid Other | Admitting: Certified Nurse Midwife

## 2016-03-03 ENCOUNTER — Other Ambulatory Visit: Payer: Self-pay | Admitting: Certified Nurse Midwife

## 2016-03-03 VITALS — BP 145/87 | HR 95 | Wt 194.0 lb

## 2016-03-03 DIAGNOSIS — Z3493 Encounter for supervision of normal pregnancy, unspecified, third trimester: Secondary | ICD-10-CM

## 2016-03-03 DIAGNOSIS — O09513 Supervision of elderly primigravida, third trimester: Secondary | ICD-10-CM

## 2016-03-03 DIAGNOSIS — O09523 Supervision of elderly multigravida, third trimester: Secondary | ICD-10-CM | POA: Diagnosis not present

## 2016-03-03 DIAGNOSIS — O0993 Supervision of high risk pregnancy, unspecified, third trimester: Secondary | ICD-10-CM

## 2016-03-03 DIAGNOSIS — O0933 Supervision of pregnancy with insufficient antenatal care, third trimester: Secondary | ICD-10-CM

## 2016-03-03 LAB — POCT URINALYSIS DIPSTICK
Bilirubin, UA: NEGATIVE
Blood, UA: POSITIVE
GLUCOSE UA: POSITIVE
KETONES UA: NEGATIVE
Nitrite, UA: NEGATIVE
Spec Grav, UA: <=1.005
UROBILINOGEN UA: NEGATIVE
pH, UA: 7

## 2016-03-03 NOTE — Assessment & Plan Note (Addendum)
  Clinic  [facility] Prenatal Labs  Dating  LMP Blood type:   A+  Genetic Screen 1 Screen:    AFP:     Quad:     NIPS: Antibody:   Anatomic [place]  Rubella: Immune (04/12 0000)  GTT Early:               Third trimester:  Missed appointment, HgBA1c drawn RPR: Nonreactive (04/12 0000)   Flu vaccine  HBsAg:     TDaP vaccine                                               Rhogam: N/A HIV:     Baby Food                                               GBS: (For PCN allergy, check sensitivities)  Contraception  Pap:  Circumcision   Repeat C-section, 4 previous C-sections  Pediatrician    Support Person

## 2016-03-03 NOTE — Progress Notes (Signed)
Subjective:    Brandy Reed is a 35 y.o. female being seen today for her obstetrical visit. She is at [redacted]w[redacted]d gestation. Patient reports no complaints. Fetal movement: normal.  Has not had regular prenatal care.  Hx of 4 previous C-sections.  Will need repeat C-section.  Labs drawn today. NST today for AMA.  Missed 2 hour OGTT.  SW consult ordered.    Problem List Items Addressed This Visit    None    Visit Diagnoses    Prenatal care in third trimester    -  Primary   Relevant Orders   POCT Urinalysis Dipstick (Completed)   CBC   Hepatitis B surface antigen   RPR   Hepatitis C antibody   HIV antibody   Comprehensive metabolic panel   Strep Gp B NAA   NuSwab VG+, Candida 6sp   Advanced maternal age in multigravida, third trimester       Relevant Orders   AMB referral to maternal fetal medicine   [place] MFM OB FOLLOW UP   Comprehensive metabolic panel   Limited prenatal care in third trimester       Relevant Orders   Hemoglobin A1c     Patient Active Problem List   Diagnosis Date Noted  . Supervision of high-risk pregnancy of elderly primigravida (>= 72 years old at delivery), third trimester 03/03/2016  . Non-reactive NST (non-stress test)   . Vaginal bleeding in pregnancy   . S/P cesarean section 07/07/2014   Objective:    BP (!) 145/87   Pulse 95   Wt 194 lb (88 kg)   LMP 05/28/2015 (Approximate)   BMI 33.30 kg/m  FHT:  145 BPM  Uterine Size: size equals dates  Presentation: cephalic   Cervix: long, closed and posterior.  Ballotable.    NST: + accels, no decels, moderate variability, Cat. 1 tracing. No contractions on toco.  Assessment:    Pregnancy @ [redacted]w[redacted]d weeks   NST reactive  Limited prenatal care  Repeat C-section  Plan:     labs reviewed, problem list updated Consent signed. GBS sent TDAP offered  Rhogam given for RH negative Pediatrician: discussed. Infant feeding: plans to breastfeed. Maternity leave: discussed. Cigarette smoking: never  smoked. Orders Placed This Encounter  Procedures  . Strep Gp B NAA  . [place] MFM OB FOLLOW UP    Standing Status:   Future    Standing Expiration Date:   05/04/2017    Order Specific Question:   Reason for Exam (SYMPTOM  OR DIAGNOSIS REQUIRED)    Answer:   placenta location, f/u anatomy, growth, maternal AMA    Order Specific Question:   Preferred imaging location?    Answer:   [facility]  . Hemoglobin A1c  . CBC  . Hepatitis B surface antigen  . RPR  . Hepatitis C antibody  . HIV antibody  . Comprehensive metabolic panel  . AMB referral to maternal fetal medicine    Referral Priority:   Routine    Referral Type:   Consultation    Referral Reason:   Specialty Services Required    Number of Visits Requested:   1  . POCT Urinalysis Dipstick   No orders of the defined types were placed in this encounter.  Follow up in 1 Week with NST.

## 2016-03-04 ENCOUNTER — Other Ambulatory Visit: Payer: Self-pay | Admitting: Certified Nurse Midwife

## 2016-03-04 DIAGNOSIS — O99019 Anemia complicating pregnancy, unspecified trimester: Secondary | ICD-10-CM

## 2016-03-04 LAB — COMPREHENSIVE METABOLIC PANEL
ALK PHOS: 126 IU/L — AB (ref 39–117)
ALT: 7 IU/L (ref 0–32)
AST: 14 IU/L (ref 0–40)
Albumin/Globulin Ratio: 1.3 (ref 1.2–2.2)
Albumin: 3.8 g/dL (ref 3.5–5.5)
BILIRUBIN TOTAL: 0.3 mg/dL (ref 0.0–1.2)
BUN/Creatinine Ratio: 13 (ref 9–23)
BUN: 7 mg/dL (ref 6–20)
CHLORIDE: 95 mmol/L — AB (ref 96–106)
CO2: 22 mmol/L (ref 18–29)
CREATININE: 0.56 mg/dL — AB (ref 0.57–1.00)
Calcium: 9.2 mg/dL (ref 8.7–10.2)
GFR calc Af Amer: 140 mL/min/{1.73_m2} (ref >59)
GFR calc non Af Amer: 121 mL/min/{1.73_m2} (ref >59)
GLOBULIN, TOTAL: 3 g/dL (ref 1.5–4.5)
Glucose: 114 mg/dL — ABNORMAL HIGH (ref 65–99)
POTASSIUM: 3.7 mmol/L (ref 3.5–5.2)
SODIUM: 135 mmol/L (ref 134–144)
Total Protein: 6.8 g/dL (ref 6.0–8.5)

## 2016-03-04 LAB — CBC
HEMATOCRIT: 31.7 % — AB (ref 34.0–46.6)
Hemoglobin: 10.5 g/dL — ABNORMAL LOW (ref 11.1–15.9)
MCH: 27.8 pg (ref 26.6–33.0)
MCHC: 33.1 g/dL (ref 31.5–35.7)
MCV: 84 fL (ref 79–97)
Platelets: 327 10*3/uL (ref 150–379)
RBC: 3.78 x10E6/uL (ref 3.77–5.28)
RDW: 14.2 % (ref 12.3–15.4)
WBC: 14.8 10*3/uL — ABNORMAL HIGH (ref 3.4–10.8)

## 2016-03-04 LAB — HEPATITIS B SURFACE ANTIGEN: HEP B S AG: NEGATIVE

## 2016-03-04 LAB — HEPATITIS C ANTIBODY

## 2016-03-04 LAB — HEMOGLOBIN A1C
ESTIMATED AVERAGE GLUCOSE: 105 mg/dL
HEMOGLOBIN A1C: 5.3 % (ref 4.8–5.6)

## 2016-03-04 LAB — HIV ANTIBODY (ROUTINE TESTING W REFLEX): HIV Screen 4th Generation wRfx: NONREACTIVE

## 2016-03-04 LAB — RPR: RPR: NONREACTIVE

## 2016-03-04 MED ORDER — IRON POLYSACCH CMPLX-B12-FA 150-0.025-1 MG PO CAPS: 1.0000 | ORAL_CAPSULE | Freq: Every day | ORAL | 4 refills | Status: DC

## 2016-03-04 NOTE — Addendum Note (Signed)
Addended by: Marya Landry D on: 03/04/2016 09:47 AM   Modules accepted: Orders

## 2016-03-06 LAB — STREP GP B NAA: Strep Gp B NAA: POSITIVE — AB

## 2016-03-07 LAB — URINE CULTURE, OB REFLEX

## 2016-03-07 LAB — CULTURE, OB URINE

## 2016-03-09 ENCOUNTER — Ambulatory Visit (HOSPITAL_COMMUNITY): Admission: RE | Admit: 2016-03-09 | Payer: Medicaid Other | Source: Ambulatory Visit

## 2016-03-09 ENCOUNTER — Other Ambulatory Visit: Payer: Self-pay | Admitting: Certified Nurse Midwife

## 2016-03-09 LAB — NUSWAB VG+, CANDIDA 6SP
CANDIDA KRUSEI, NAA: NEGATIVE
CHLAMYDIA TRACHOMATIS, NAA: NEGATIVE
Candida albicans, NAA: POSITIVE — AB
Candida glabrata, NAA: NEGATIVE
Candida lusitaniae, NAA: NEGATIVE
Candida parapsilosis, NAA: NEGATIVE
Candida tropicalis, NAA: NEGATIVE
NEISSERIA GONORRHOEAE, NAA: NEGATIVE
TRICH VAG BY NAA: NEGATIVE

## 2016-03-10 ENCOUNTER — Ambulatory Visit (INDEPENDENT_AMBULATORY_CARE_PROVIDER_SITE_OTHER): Payer: Medicaid Other | Admitting: Obstetrics and Gynecology

## 2016-03-10 ENCOUNTER — Encounter: Payer: Medicaid Other | Admitting: Obstetrics and Gynecology

## 2016-03-10 VITALS — BP 126/85 | HR 87 | Temp 98.5°F | Wt 192.5 lb

## 2016-03-10 DIAGNOSIS — O34219 Maternal care for unspecified type scar from previous cesarean delivery: Secondary | ICD-10-CM | POA: Diagnosis not present

## 2016-03-10 DIAGNOSIS — O09513 Supervision of elderly primigravida, third trimester: Secondary | ICD-10-CM

## 2016-03-10 DIAGNOSIS — O09523 Supervision of elderly multigravida, third trimester: Secondary | ICD-10-CM | POA: Diagnosis not present

## 2016-03-10 DIAGNOSIS — O99820 Streptococcus B carrier state complicating pregnancy: Secondary | ICD-10-CM | POA: Insufficient documentation

## 2016-03-10 DIAGNOSIS — O09529 Supervision of elderly multigravida, unspecified trimester: Secondary | ICD-10-CM | POA: Insufficient documentation

## 2016-03-10 MED ORDER — FLUCONAZOLE 150 MG PO TABS
150.0000 mg | ORAL_TABLET | Freq: Once | ORAL | 0 refills | Status: AC
Start: 2016-03-10 — End: 2016-03-10

## 2016-03-10 NOTE — Progress Notes (Signed)
Subjective:  Brandy Reed is a 35 y.o. [id number] at [redacted]w[redacted]d being seen today for ongoing prenatal care.  She is currently monitored for the following issues for this high-risk pregnancy and has S/P cesarean section; Non-reactive NST (non-stress test); Vaginal bleeding in pregnancy; Supervision of high-risk pregnancy of elderly primigravida (>= 65 years old at delivery), third trimester; GBS (group B Streptococcus carrier), +RV culture, currently pregnant; Previous cesarean section complicating pregnancy; and AMA (advanced maternal age) multigravida 35+ on her problem list.  Patient reports vaginal irritation.  Contractions: Not present. Vag. Bleeding: None.  Movement: Present. Denies leaking of fluid.   The following portions of the patient's history were reviewed and updated as appropriate: allergies, current medications, past family history, past medical history, past social history, past surgical history and problem list. Problem list updated.  Objective:   Vitals:   03/10/16 1643  BP: 126/85  Pulse: 87  Temp: 98.5 F (36.9 C)  Weight: 192 lb 8 oz (87.3 kg)    Fetal Status:     Movement: Present     General:  Alert, oriented and cooperative. Patient is in no acute distress.  Skin: Skin is warm and dry. No rash noted.   Cardiovascular: Normal heart rate noted  Respiratory: Normal respiratory effort, no problems with respiration noted  Abdomen: Soft, gravid, appropriate for gestational age. Pain/Pressure: Present     Pelvic:  Cervical exam deferred        Extremities: Normal range of motion.  Edema: Trace  Mental Status: Normal mood and affect. Normal behavior. Normal judgment and thought content.   Urinalysis:      Assessment and Plan:  Pregnancy: [id number] at [redacted]w[redacted]d  1. Supervision of high-risk pregnancy of elderly primigravida (>= 56 years old at delivery), third trimester Patient is doing well. Positive yeast infection at last visit not treated. Will order diflucan Growth  ultrasound re-scheduled as patient missed the last appointment - POCT Urinalysis Dipstick - [place] MFM OB FOLLOW UP; Future  2. Previous cesarean section complicating pregnancy Patient will be scheduled for repeat cesarean section. She was hoping for a 9/4 delivery.  3. AMA (advanced maternal age) multigravida 35+, third trimester   Preterm labor symptoms and general obstetric precautions including but not limited to vaginal bleeding, contractions, leaking of fluid and fetal movement were reviewed in detail with the patient. Please refer to After Visit Summary for other counseling recommendations.  Return in about 1 week (around 03/17/2016).   Catalina Antigua, MD

## 2016-03-10 NOTE — Progress Notes (Signed)
Pt c/o vaginal itching and lower abdominal/vaginal pain-pressure

## 2016-03-12 ENCOUNTER — Encounter (HOSPITAL_COMMUNITY): Payer: Self-pay | Admitting: *Deleted

## 2016-03-17 ENCOUNTER — Encounter: Payer: Medicaid Other | Admitting: Obstetrics & Gynecology

## 2016-03-18 ENCOUNTER — Encounter: Payer: Self-pay | Admitting: *Deleted

## 2016-03-19 ENCOUNTER — Telehealth (HOSPITAL_COMMUNITY): Payer: Self-pay | Admitting: *Deleted

## 2016-03-19 NOTE — Telephone Encounter (Signed)
Preadmission screen  

## 2016-03-22 ENCOUNTER — Encounter (HOSPITAL_COMMUNITY): Payer: Self-pay

## 2016-03-22 ENCOUNTER — Encounter: Payer: Medicaid Other | Admitting: Obstetrics & Gynecology

## 2016-03-24 ENCOUNTER — Ambulatory Visit (HOSPITAL_COMMUNITY): Admission: RE | Admit: 2016-03-24 | Payer: Medicaid Other | Source: Ambulatory Visit

## 2016-03-26 ENCOUNTER — Encounter (HOSPITAL_COMMUNITY)
Admission: RE | Admit: 2016-03-26 | Discharge: 2016-03-26 | Disposition: A | Discharge status: Home / Self Care | Payer: Medicaid Other | Source: Ambulatory Visit | Attending: Family Medicine | Admitting: Family Medicine

## 2016-03-26 HISTORY — DX: Nausea with vomiting, unspecified: R11.2

## 2016-03-26 HISTORY — DX: Other specified postprocedural states: Z98.890

## 2016-03-26 LAB — BASIC METABOLIC PANEL
ANION GAP: 5 (ref 5–15)
BUN: 9 mg/dL (ref 6–20)
CO2: 26 mmol/L (ref 22–32)
CREATININE: 0.59 mg/dL (ref 0.44–1.00)
Calcium: 8.9 mg/dL (ref 8.9–10.3)
Chloride: 102 mmol/L (ref 101–111)
GFR calc non Af Amer: >60 mL/min (ref >60)
Glucose, Bld: 92 mg/dL (ref 65–99)
POTASSIUM: 3.6 mmol/L (ref 3.5–5.1)
Sodium: 133 mmol/L — ABNORMAL LOW (ref 135–145)

## 2016-03-26 LAB — CBC
HEMATOCRIT: 28.5 % — AB (ref 36.0–46.0)
Hemoglobin: 9.6 g/dL — ABNORMAL LOW (ref 12.0–15.0)
MCH: 27.7 pg (ref 26.0–34.0)
MCHC: 33.7 g/dL (ref 30.0–36.0)
MCV: 82.1 fL (ref 78.0–100.0)
Platelets: 304 10*3/uL (ref 150–400)
RBC: 3.47 MIL/uL — AB (ref 3.87–5.11)
RDW: 14 % (ref 11.5–15.5)
WBC: 9.7 10*3/uL (ref 4.0–10.5)

## 2016-03-26 NOTE — Patient Instructions (Signed)
20 Delene RuffiniJoesanna D Seat  03/26/2016   Your procedure is scheduled on:  03/29/2016  [facility] at 1100 AM.  Pick up the phone at the desk and dial [phone].   Call this number if you have problems the morning of surgery: [phone]   Remember:   Do not eat food:After Midnight.  Do not drink clear liquids: After Midnight.  Take these medicines the morning of surgery with A SIP OF WATER: none   Do not wear jewelry, make-up or nail polish.  Do not wear lotions, powders, or perfumes. You may wear deodorant.  Do not shave 48 hours prior to surgery.  Do not bring valuables to the hospital.  [facility] is not   responsible for any belongings or valuables brought to the hospital.  Contacts, dentures or bridgework may not be worn into surgery.  Leave suitcase in the car. After surgery it may be brought to your room.  For patients admitted to the hospital, checkout time is 11:00 AM the day of              discharge.   Patients discharged the day of surgery will not be allowed to drive             home.  Name and phone number of your driver: na  Special Instructions:   N/A   Please read over the following fact sheets that you were given:   Surgical Site Infection Prevention

## 2016-03-27 LAB — RPR: RPR Ser Ql: NONREACTIVE

## 2016-03-28 MED ORDER — SCOPOLAMINE 1 MG/3DAYS TD PT72
1.0000 | MEDICATED_PATCH | Freq: Once | TRANSDERMAL | Status: DC
  Administered 2016-03-29: 1.5 mg via TRANSDERMAL

## 2016-03-29 ENCOUNTER — Encounter (HOSPITAL_COMMUNITY)
Admission: RE | Disposition: A | Discharge status: Home / Self Care | Payer: Self-pay | Source: Ambulatory Visit | Attending: Family Medicine

## 2016-03-29 ENCOUNTER — Inpatient Hospital Stay (HOSPITAL_COMMUNITY)
Admission: RE | Admit: 2016-03-29 | Discharge: 2016-04-01 | DRG: 766 | Disposition: A | Discharge status: Home / Self Care | Payer: Medicaid Other | Source: Ambulatory Visit | Attending: Family Medicine | Admitting: Family Medicine

## 2016-03-29 ENCOUNTER — Inpatient Hospital Stay (HOSPITAL_COMMUNITY): Payer: Medicaid Other | Admitting: Certified Registered Nurse Anesthetist

## 2016-03-29 ENCOUNTER — Encounter (HOSPITAL_COMMUNITY): Payer: Self-pay | Admitting: Certified Registered Nurse Anesthetist

## 2016-03-29 DIAGNOSIS — Z98891 History of uterine scar from previous surgery: Secondary | ICD-10-CM

## 2016-03-29 DIAGNOSIS — O99824 Streptococcus B carrier state complicating childbirth: Secondary | ICD-10-CM | POA: Diagnosis present

## 2016-03-29 DIAGNOSIS — O99334 Smoking (tobacco) complicating childbirth: Secondary | ICD-10-CM | POA: Diagnosis not present

## 2016-03-29 DIAGNOSIS — O9902 Anemia complicating childbirth: Secondary | ICD-10-CM | POA: Diagnosis not present

## 2016-03-29 DIAGNOSIS — Z3A39 39 weeks gestation of pregnancy: Secondary | ICD-10-CM | POA: Diagnosis not present

## 2016-03-29 DIAGNOSIS — O34211 Maternal care for low transverse scar from previous cesarean delivery: Secondary | ICD-10-CM | POA: Diagnosis not present

## 2016-03-29 LAB — PREPARE RBC (CROSSMATCH)

## 2016-03-29 SURGERY — Surgical Case
Anesthesia: Spinal | Site: Abdomen

## 2016-03-29 MED ORDER — DEXAMETHASONE SODIUM PHOSPHATE 4 MG/ML IJ SOLN
INTRAMUSCULAR | Status: AC
  Filled 2016-03-29: qty 1

## 2016-03-29 MED ORDER — LACTATED RINGERS IV SOLN
INTRAVENOUS | Status: DC | PRN
  Administered 2016-03-29: 13:00:00 via INTRAVENOUS

## 2016-03-29 MED ORDER — ACETAMINOPHEN 325 MG PO TABS
650.0000 mg | ORAL_TABLET | ORAL | Status: DC | PRN
  Administered 2016-03-31: 650 mg via ORAL
  Filled 2016-03-29: qty 2

## 2016-03-29 MED ORDER — HYDROMORPHONE HCL 1 MG/ML IJ SOLN
INTRAMUSCULAR | Status: AC
  Filled 2016-03-29: qty 1

## 2016-03-29 MED ORDER — NALBUPHINE HCL 10 MG/ML IJ SOLN: 5.0000 mg | Freq: Once | INTRAMUSCULAR | Status: DC | PRN

## 2016-03-29 MED ORDER — ACETAMINOPHEN 500 MG PO TABS
1000.0000 mg | ORAL_TABLET | Freq: Four times a day (QID) | ORAL | Status: DC
  Administered 2016-03-29: 1000 mg via ORAL

## 2016-03-29 MED ORDER — LACTATED RINGERS IV SOLN
INTRAVENOUS | Status: DC | PRN
  Administered 2016-03-29: 40 [IU] via INTRAVENOUS

## 2016-03-29 MED ORDER — FENTANYL CITRATE (PF) 100 MCG/2ML IJ SOLN
INTRAMUSCULAR | Status: AC
  Filled 2016-03-29: qty 2

## 2016-03-29 MED ORDER — TETANUS-DIPHTH-ACELL PERTUSSIS 5-2.5-18.5 LF-MCG/0.5 IM SUSP: 0.5000 mL | Freq: Once | INTRAMUSCULAR | Status: DC

## 2016-03-29 MED ORDER — DIBUCAINE 1 % RE OINT: 1.0000 "application " | TOPICAL_OINTMENT | RECTAL | Status: DC | PRN

## 2016-03-29 MED ORDER — NALOXONE HCL 2 MG/2ML IJ SOSY: 1.0000 ug/kg/h | PREFILLED_SYRINGE | INTRAVENOUS | Status: DC | PRN

## 2016-03-29 MED ORDER — MORPHINE SULFATE-NACL 0.5-0.9 MG/ML-% IV SOSY
PREFILLED_SYRINGE | INTRAVENOUS | Status: AC
  Filled 2016-03-29: qty 1

## 2016-03-29 MED ORDER — METOCLOPRAMIDE HCL 5 MG/ML IJ SOLN
INTRAMUSCULAR | Status: DC | PRN
  Administered 2016-03-29: 10 mg via INTRAVENOUS

## 2016-03-29 MED ORDER — SCOPOLAMINE 1 MG/3DAYS TD PT72: 1.0000 | MEDICATED_PATCH | Freq: Once | TRANSDERMAL | Status: DC

## 2016-03-29 MED ORDER — ONDANSETRON HCL 4 MG/2ML IJ SOLN: 4.0000 mg | Freq: Three times a day (TID) | INTRAMUSCULAR | Status: DC | PRN

## 2016-03-29 MED ORDER — MORPHINE SULFATE-NACL 0.5-0.9 MG/ML-% IV SOSY
PREFILLED_SYRINGE | INTRAVENOUS | Status: DC | PRN
  Administered 2016-03-29: .1 mg via INTRATHECAL

## 2016-03-29 MED ORDER — METOCLOPRAMIDE HCL 5 MG/ML IJ SOLN
INTRAMUSCULAR | Status: AC
  Filled 2016-03-29: qty 2

## 2016-03-29 MED ORDER — ACETAMINOPHEN 500 MG PO TABS
ORAL_TABLET | ORAL | Status: AC
  Filled 2016-03-29: qty 2

## 2016-03-29 MED ORDER — NALOXONE HCL 0.4 MG/ML IJ SOLN: 0.4000 mg | INTRAMUSCULAR | Status: DC | PRN

## 2016-03-29 MED ORDER — MENTHOL 3 MG MT LOZG: 1.0000 | LOZENGE | OROMUCOSAL | Status: DC | PRN

## 2016-03-29 MED ORDER — CEFAZOLIN SODIUM-DEXTROSE 2-3 GM-% IV SOLR
INTRAVENOUS | Status: DC | PRN
  Administered 2016-03-29: 2 g via INTRAVENOUS

## 2016-03-29 MED ORDER — NALBUPHINE HCL 10 MG/ML IJ SOLN: 5.0000 mg | INTRAMUSCULAR | Status: DC | PRN

## 2016-03-29 MED ORDER — PRENATAL MULTIVITAMIN CH
1.0000 | ORAL_TABLET | Freq: Every day | ORAL | Status: DC
Start: 2016-03-30 — End: 2016-04-01
  Administered 2016-03-30 – 2016-04-01 (×3): 1 via ORAL
  Filled 2016-03-29 (×3): qty 1

## 2016-03-29 MED ORDER — OXYCODONE-ACETAMINOPHEN 5-325 MG PO TABS: 1.0000 | ORAL_TABLET | ORAL | Status: DC | PRN

## 2016-03-29 MED ORDER — ACETAMINOPHEN 160 MG/5ML PO SOLN: 975.0000 mg | Freq: Once | ORAL | Status: DC

## 2016-03-29 MED ORDER — FENTANYL CITRATE (PF) 100 MCG/2ML IJ SOLN
INTRAMUSCULAR | Status: DC | PRN
  Administered 2016-03-29: 25 ug via INTRATHECAL

## 2016-03-29 MED ORDER — SIMETHICONE 80 MG PO CHEW: 80.0000 mg | CHEWABLE_TABLET | ORAL | Status: DC | PRN

## 2016-03-29 MED ORDER — ZOLPIDEM TARTRATE 5 MG PO TABS: 5.0000 mg | ORAL_TABLET | Freq: Every evening | ORAL | Status: DC | PRN

## 2016-03-29 MED ORDER — OXYCODONE-ACETAMINOPHEN 5-325 MG PO TABS
2.0000 | ORAL_TABLET | ORAL | Status: DC | PRN
  Administered 2016-03-30 – 2016-03-31 (×7): 2 via ORAL
  Filled 2016-03-29 (×7): qty 2

## 2016-03-29 MED ORDER — WITCH HAZEL-GLYCERIN EX PADS: 1.0000 "application " | MEDICATED_PAD | CUTANEOUS | Status: DC | PRN

## 2016-03-29 MED ORDER — MEPERIDINE HCL 25 MG/ML IJ SOLN: 6.2500 mg | INTRAMUSCULAR | Status: DC | PRN

## 2016-03-29 MED ORDER — SODIUM CHLORIDE 0.9% FLUSH: 3.0000 mL | INTRAVENOUS | Status: DC | PRN

## 2016-03-29 MED ORDER — METOCLOPRAMIDE HCL 10 MG PO TABS: 10.0000 mg | ORAL_TABLET | Freq: Three times a day (TID) | ORAL | Status: DC | PRN

## 2016-03-29 MED ORDER — SENNOSIDES-DOCUSATE SODIUM 8.6-50 MG PO TABS
2.0000 | ORAL_TABLET | ORAL | Status: DC
  Administered 2016-03-30 – 2016-04-01 (×3): 2 via ORAL
  Filled 2016-03-29 (×3): qty 2

## 2016-03-29 MED ORDER — DIPHENHYDRAMINE HCL 50 MG/ML IJ SOLN: 12.5000 mg | INTRAMUSCULAR | Status: DC | PRN

## 2016-03-29 MED ORDER — LACTATED RINGERS IV SOLN
INTRAVENOUS | Status: DC
  Administered 2016-03-30: via INTRAVENOUS

## 2016-03-29 MED ORDER — PHENYLEPHRINE 8 MG IN D5W 100 ML (0.08MG/ML) PREMIX OPTIME
INJECTION | INTRAVENOUS | Status: AC
  Filled 2016-03-29: qty 100

## 2016-03-29 MED ORDER — ONDANSETRON HCL 4 MG/2ML IJ SOLN
INTRAMUSCULAR | Status: AC
  Filled 2016-03-29: qty 2

## 2016-03-29 MED ORDER — DEXAMETHASONE SODIUM PHOSPHATE 4 MG/ML IJ SOLN
INTRAMUSCULAR | Status: DC | PRN
  Administered 2016-03-29: 4 mg via INTRAVENOUS

## 2016-03-29 MED ORDER — BUPIVACAINE 0.5 % ON-Q PUMP SINGLE CATH 300 ML
300.0000 mL | INJECTION | Status: DC
  Filled 2016-03-29: qty 300

## 2016-03-29 MED ORDER — SIMETHICONE 80 MG PO CHEW
80.0000 mg | CHEWABLE_TABLET | ORAL | Status: DC
  Administered 2016-03-30 – 2016-04-01 (×3): 80 mg via ORAL
  Filled 2016-03-29 (×3): qty 1

## 2016-03-29 MED ORDER — SIMETHICONE 80 MG PO CHEW
80.0000 mg | CHEWABLE_TABLET | Freq: Three times a day (TID) | ORAL | Status: DC
  Administered 2016-03-30 – 2016-04-01 (×7): 80 mg via ORAL
  Filled 2016-03-29 (×7): qty 1

## 2016-03-29 MED ORDER — DIPHENHYDRAMINE HCL 25 MG PO CAPS: 25.0000 mg | ORAL_CAPSULE | ORAL | Status: DC | PRN

## 2016-03-29 MED ORDER — LACTATED RINGERS IV SOLN
Freq: Once | INTRAVENOUS | Status: AC
  Administered 2016-03-29: 12:00:00 via INTRAVENOUS

## 2016-03-29 MED ORDER — IBUPROFEN 600 MG PO TABS
600.0000 mg | ORAL_TABLET | Freq: Four times a day (QID) | ORAL | Status: DC
  Administered 2016-03-29 – 2016-04-01 (×12): 600 mg via ORAL
  Filled 2016-03-29 (×12): qty 1

## 2016-03-29 MED ORDER — COCONUT OIL OIL
1.0000 | TOPICAL_OIL | Status: DC | PRN
Start: 2016-03-29 — End: 2016-04-01

## 2016-03-29 MED ORDER — BUPIVACAINE IN DEXTROSE 0.75-8.25 % IT SOLN
INTRATHECAL | Status: AC
  Filled 2016-03-29: qty 2

## 2016-03-29 MED ORDER — SCOPOLAMINE 1 MG/3DAYS TD PT72
MEDICATED_PATCH | TRANSDERMAL | Status: AC
  Administered 2016-03-29: 1.5 mg via TRANSDERMAL
  Filled 2016-03-29: qty 1

## 2016-03-29 MED ORDER — OXYTOCIN 40 UNITS IN LACTATED RINGERS INFUSION - SIMPLE MED: 2.5000 [IU]/h | INTRAVENOUS | Status: AC

## 2016-03-29 MED ORDER — PHENYLEPHRINE 8 MG IN D5W 100 ML (0.08MG/ML) PREMIX OPTIME
INJECTION | INTRAVENOUS | Status: DC | PRN
  Administered 2016-03-29: 30 ug/min via INTRAVENOUS

## 2016-03-29 MED ORDER — DIPHENHYDRAMINE HCL 25 MG PO CAPS: 25.0000 mg | ORAL_CAPSULE | Freq: Four times a day (QID) | ORAL | Status: DC | PRN

## 2016-03-29 MED ORDER — LACTATED RINGERS IV SOLN
INTRAVENOUS | Status: DC
  Administered 2016-03-29 (×2): via INTRAVENOUS

## 2016-03-29 MED ORDER — HYDROMORPHONE HCL 1 MG/ML IJ SOLN
0.2500 mg | INTRAMUSCULAR | Status: DC | PRN
  Administered 2016-03-29 (×2): 0.5 mg via INTRAVENOUS
  Administered 2016-03-29: 0.25 mg via INTRAVENOUS

## 2016-03-29 MED ORDER — BUPIVACAINE IN DEXTROSE 0.75-8.25 % IT SOLN
INTRATHECAL | Status: DC | PRN
  Administered 2016-03-29: 1.5 mL via INTRATHECAL

## 2016-03-29 MED ORDER — PANTOPRAZOLE SODIUM 40 MG PO TBEC
40.0000 mg | DELAYED_RELEASE_TABLET | Freq: Every day | ORAL | Status: DC
Start: 2016-03-30 — End: 2016-04-01
  Administered 2016-03-30 – 2016-04-01 (×3): 40 mg via ORAL
  Filled 2016-03-29 (×3): qty 1

## 2016-03-29 MED ORDER — ONDANSETRON HCL 4 MG/2ML IJ SOLN
INTRAMUSCULAR | Status: DC | PRN
  Administered 2016-03-29: 4 mg via INTRAVENOUS

## 2016-03-29 MED ORDER — OXYTOCIN 10 UNIT/ML IJ SOLN
INTRAMUSCULAR | Status: AC
  Filled 2016-03-29: qty 4

## 2016-03-29 SURGICAL SUPPLY — 35 items
APL SKNCLS STERI-STRIP NONHPOA (GAUZE/BANDAGES/DRESSINGS) ×1
BENZOIN TINCTURE PRP APPL 2/3 (GAUZE/BANDAGES/DRESSINGS) ×3 IMPLANT
CHLORAPREP W/TINT 26ML (MISCELLANEOUS) ×3 IMPLANT
CLAMP CORD UMBIL (MISCELLANEOUS) IMPLANT
CLOSURE STERI STRIP 1/2 X4 (GAUZE/BANDAGES/DRESSINGS) ×1 IMPLANT
CLOSURE WOUND 1/2 X4 (GAUZE/BANDAGES/DRESSINGS) ×1
CLOTH BEACON ORANGE TIMEOUT ST (SAFETY) ×3 IMPLANT
DRSG OPSITE POSTOP 4X10 (GAUZE/BANDAGES/DRESSINGS) ×3 IMPLANT
ELECT REM PT RETURN 9FT ADLT (ELECTROSURGICAL) ×3
ELECTRODE REM PT RTRN 9FT ADLT (ELECTROSURGICAL) ×1 IMPLANT
EXTRACTOR VACUUM M CUP 4 TUBE (SUCTIONS) IMPLANT
EXTRACTOR VACUUM M CUP 4' TUBE (SUCTIONS)
GLOVE BIOGEL PI IND STRL 7.0 (GLOVE) ×2 IMPLANT
GLOVE BIOGEL PI IND STRL 7.5 (GLOVE) ×2 IMPLANT
GLOVE BIOGEL PI INDICATOR 7.0 (GLOVE) ×4
GLOVE BIOGEL PI INDICATOR 7.5 (GLOVE) ×4
GLOVE ECLIPSE 7.5 STRL STRAW (GLOVE) ×3 IMPLANT
GOWN STRL REUS W/TWL LRG LVL3 (GOWN DISPOSABLE) ×9 IMPLANT
KIT ABG SYR 3ML LUER SLIP (SYRINGE) IMPLANT
NDL HYPO 25X5/8 SAFETYGLIDE (NEEDLE) IMPLANT
NEEDLE HYPO 25X5/8 SAFETYGLIDE (NEEDLE) IMPLANT
NS IRRIG 1000ML POUR BTL (IV SOLUTION) ×3 IMPLANT
PACK C SECTION WH (CUSTOM PROCEDURE TRAY) ×3 IMPLANT
PAD ABD 7.5X8 STRL (GAUZE/BANDAGES/DRESSINGS) ×2 IMPLANT
PAD OB MATERNITY 4.3X12.25 (PERSONAL CARE ITEMS) ×3 IMPLANT
PENCIL SMOKE EVAC W/HOLSTER (ELECTROSURGICAL) ×3 IMPLANT
RTRCTR C-SECT PINK 25CM LRG (MISCELLANEOUS) ×3 IMPLANT
STRIP CLOSURE SKIN 1/2X4 (GAUZE/BANDAGES/DRESSINGS) ×2 IMPLANT
SUT VIC AB 0 CTX 36 (SUTURE) ×9
SUT VIC AB 0 CTX36XBRD ANBCTRL (SUTURE) ×3 IMPLANT
SUT VIC AB 2-0 CT1 27 (SUTURE) ×3
SUT VIC AB 2-0 CT1 TAPERPNT 27 (SUTURE) ×1 IMPLANT
SUT VIC AB 4-0 KS 27 (SUTURE) ×3 IMPLANT
TOWEL OR 17X24 6PK STRL BLUE (TOWEL DISPOSABLE) ×3 IMPLANT
TRAY FOLEY CATH SILVER 14FR (SET/KITS/TRAYS/PACK) ×3 IMPLANT

## 2016-03-29 NOTE — Addendum Note (Signed)
Addendum  created 03/29/16 2023 by Shanon PayorSuzanne M Dryden Tapley, CRNA   Sign clinical note

## 2016-03-29 NOTE — H&P (Signed)
Faculty Practice H&P  Brandy Reed is a 35 y.o. female [id number] with IUP at 121w2d presenting for elective repeat cesarean section. Pregnancy was been complicated by 4 prior sections.    Pt states she has been having no contractions, no vaginal bleeding, intact membranes, with normal fetal movement.     Prenatal Course Source of Care: [facility] with onset of care at 22 weeks Pregnancy complications or risks: Patient Active Problem List   Diagnosis Date Noted  . GBS (group B Streptococcus carrier), +RV culture, currently pregnant 03/10/2016  . Previous cesarean section complicating pregnancy 03/10/2016  . AMA (advanced maternal age) multigravida 35+ 03/10/2016  . Supervision of high-risk pregnancy of elderly primigravida (>= 36 years old at delivery), third trimester 03/03/2016   She desires to uncertain.  She plans to plans to breastfeed  Prenatal labs and studies: ABO, Rh: --/--/A POS (08/18 1235) Antibody: NEG (08/18 1235) Rubella: !Error! RPR: Non Reactive (08/18 1235)  HBsAg: Negative (07/26 1744)  HIV: Non Reactive (07/26 1744)  GBS: Positive (07/27 1012)  1 hr Glucola 92 Genetic screeningdeclined Anatomy US normal  Past Medical History:  Past Medical History:  Diagnosis Date  . Depression    postpartum  . Headache(784.0)   . PONV (postoperative nausea and vomiting)   . Sinus disorder    sometimes gets HA's    Past Surgical History:  Past Surgical History:  Procedure Laterality Date  . CESAREAN SECTION  X 3  . CESAREAN SECTION N/A 07/07/2014   Procedure: CESAREAN SECTION;  Surgeon: Kathreen CosierBernard A Marshall, MD;  Location: [facility];  Service: Obstetrics;  Laterality: N/A;  . DILATION AND CURETTAGE OF UTERUS    . NOSE SURGERY    . WISDOM TOOTH EXTRACTION      Obstetrical History:  OB History    Gravida Para Term Preterm AB Living   7 5 5   1 5    SAB TAB Ectopic Multiple Live Births   1     0 5      Gynecological History:  OB History    Gravida Para Term  Preterm AB Living   7 5 5   1 5    SAB TAB Ectopic Multiple Live Births   1     0 5      Social History:  Social History   Social History  . Marital status: Single    Spouse name: N/A  . Number of children: N/A  . Years of education: N/A   Social History Main Topics  . Smoking status: Current Every Day Smoker    Packs/day: 0.25    Types: Cigars  . Smokeless tobacco: Never Used  . Alcohol use No  . Drug use: No  . Sexual activity: Yes    Birth control/ protection: None   Other Topics Concern  . None   Social History Narrative  . None    Family History: History reviewed. No pertinent family history.  Medications:  Prenatal vitamins,  Current Facility-Administered Medications  Medication Dose Route Frequency Provider Last Rate Last Dose  . bupivacaine 0.5 % ON-Q pump SINGLE CATH 300 mL  300 mL Other Continuous Rhona RaiderJacob J Stinson, DO      . lactated ringers infusion   Intravenous Continuous Cristela BlueKyle Jackson, MD      . scopolamine (TRANSDERM-SCOP) 1 MG/3DAYS 1.5 mg  1 patch Transdermal Once Cristela BlueKyle Jackson, MD      . scopolamine (TRANSDERM-SCOP) 1 MG/3DAYS  Allergies: No Known Allergies  Review of Systems: - negative  Physical Exam: Blood pressure (!) 139/105, pulse 84, temperature 98.4 F (36.9 C), temperature source Oral, resp. rate 18, last menstrual period 05/28/2015, SpO2 100 %, unknown if currently breastfeeding. GENERAL: Well-developed, well-nourished female in no acute distress.  LUNGS: Clear to auscultation bilaterally.  HEART: Regular rate and rhythm. ABDOMEN: Soft, nontender, nondistended, gravid. EFW 7.5-8 lbs EXTREMITIES: Nontender, no edema, 2+ distal pulses.    Pertinent Labs/Studies:   CBC    Component Value Date/Time   WBC 9.7 03/26/2016 1235   RBC 3.47 (L) 03/26/2016 1235   HGB 9.6 (L) 03/26/2016 1235   HGB 10.7 11/19/2015   HCT 28.5 (L) 03/26/2016 1235   HCT 31.7 (L) 03/03/2016 1744   HCT 32 11/19/2015   PLT 304 03/26/2016 1235   PLT  327 03/03/2016 1744   PLT 426 11/19/2015   MCV 82.1 03/26/2016 1235   MCV 84 03/03/2016 1744   MCH 27.7 03/26/2016 1235   MCHC 33.7 03/26/2016 1235   RDW 14.0 03/26/2016 1235   RDW 14.2 03/03/2016 1744     Assessment : Brandy Reed is a 35 y.o. [id number] at 2560w2d being admitted for cesarean section secondary to elective repeat  Plan: The risks of cesarean section discussed with the patient included but were not limited to: bleeding which may require transfusion or reoperation; infection which may require antibiotics; injury to bowel, bladder, ureters or other surrounding organs; injury to the fetus; need for additional procedures including hysterectomy in the event of a life-threatening hemorrhage; placental abnormalities wth subsequent pregnancies, incisional problems, thromboembolic phenomenon and other postoperative/anesthesia complications. The patient concurred with the proposed plan, giving informed written consent for the procedure.   Patient has been NPO since last night and will remain NPO for procedure.  Preoperative prophylactic Ancef ordered on call to the OR.    Levie HeritageJacob J Stinson, DO 03/29/2016, 11:54 AM

## 2016-03-29 NOTE — Anesthesia Preprocedure Evaluation (Signed)
Anesthesia Evaluation  Patient identified by MRN, date of birth, ID band Patient awake    Reviewed: Allergy & Precautions, H&P , Patient's Chart, lab work & pertinent test results  Airway Mallampati: II  TM Distance: >3 FB Neck ROM: full    Dental no notable dental hx.    Pulmonary Current Smoker,    Pulmonary exam normal breath sounds clear to auscultation       Cardiovascular Exercise Tolerance: Good  Rhythm:regular Rate:Normal     Neuro/Psych    GI/Hepatic   Endo/Other    Renal/GU      Musculoskeletal   Abdominal   Peds  Hematology  (+) anemia ,   Anesthesia Other Findings   Reproductive/Obstetrics                             Anesthesia Physical Anesthesia Plan  ASA: II  Anesthesia Plan: Spinal   Post-op Pain Management:    Induction:   Airway Management Planned:   Additional Equipment:   Intra-op Plan:   Post-operative Plan:   Informed Consent: I have reviewed the patients History and Physical, chart, labs and discussed the procedure including the risks, benefits and alternatives for the proposed anesthesia with the patient or authorized representative who has indicated his/her understanding and acceptance.   Dental Advisory Given  Plan Discussed with: CRNA  Anesthesia Plan Comments: (Lab work confirmed with CRNA in room. Platelets okay. Discussed spinal anesthetic, and patient consents to the procedure:  included risk of possible headache,backache, failed block, allergic reaction, and nerve injury. This patient was asked if she had any questions or concerns before the procedure started. )        Anesthesia Quick Evaluation

## 2016-03-29 NOTE — Op Note (Addendum)
Brandy RuffiniJoesanna D Reed PROCEDURE DATE: 03/29/2016  PREOPERATIVE DIAGNOSIS: Intrauterine pregnancy at  5461w2d weeks gestation; 4 prior cesarean section  POSTOPERATIVE DIAGNOSIS: The same  PROCEDURE: Repeat Low Transverse Cesarean Section  SURGEON:  Dr. Candelaria CelesteJacob Oda Lansdowne  ASSISTANT: Dr Erin FullingHarraway-Smith, Dr Genevie AnnSchenk.  INDICATIONS: Brandy RushingJoesanna D Reed is a 35 y.o. [id number] at 4461w2d scheduled for cesarean section secondary to Prior sections x 4.  The risks of cesarean section discussed with the patient included but were not limited to: bleeding which may require transfusion or reoperation; infection which may require antibiotics; injury to bowel, bladder, ureters or other surrounding organs; injury to the fetus; need for additional procedures including hysterectomy in the event of a life-threatening hemorrhage; placental abnormalities wth subsequent pregnancies, incisional problems, thromboembolic phenomenon and other postoperative/anesthesia complications. The patient concurred with the proposed plan, giving informed written consent for the procedure.    FINDINGS:  Viable female infant in vertex presentation.  Apgars 8 and 9, weight 7 pounds and 12 ounces.  Clear amniotic fluid.  Intact placenta, three vessel cord.  Normal uterus, fallopian tubes and ovaries bilaterally.  ANESTHESIA:    Spinal INTRAVENOUS FLUIDS: 2400 ml ESTIMATED BLOOD LOSS: 600 ml URINE OUTPUT:  650 ml SPECIMENS: Placenta sent to L&D COMPLICATIONS: None immediate  PROCEDURE IN DETAIL:  The patient received intravenous antibiotics and had sequential compression devices applied to her lower extremities while in the preoperative area.  She was then taken to the operating room where spinal anesthesia was administered and epidural anesthesia was placed and was found to be adequate. She was then placed in a dorsal supine position with a leftward tilt, and prepped and draped in a sterile manner.  A foley catheter was placed into her bladder and  attached to constant gravity, which drained clear fluid throughout.  After an adequate timeout was performed, a Pfannenstiel skin incision was made with scalpel and carried through to the underlying layer of fascia. The fascia was incised in the midline and this incision was extended bilaterally using the Mayo scissors. Kocher clamps were applied to the superior aspect of the fascial incision and the underlying rectus muscles were dissected off with cautery.  Due to extensive adhesions of the fascia to the rectus muscles, the rectus muscles were transected with cautery approximately 3cm from the midline on either side.  A bladder blade was placed to aid in visualization of the uterus.  Attention was turned to the lower uterine segment.  The vesicoperitoneum was elevated and incised with Metzenbaum scissors and the bladder flap was retracted.  A transverse hysterotomy was made with a scalpel and extended bilaterally bluntly. The infant was successfully delivered, and cord was clamped and cut and infant was handed over to awaiting neonatology team. Uterine massage was then administered and the placenta delivered intact with three-vessel cord. The uterus was then cleared of clot and debris.  The hysterotomy was closed with 0 Vicryl in a running locked fashion, and an imbricating layer was also placed with a 0 Monocryl. Overall, excellent hemostasis was noted. The abdomen and the pelvis were cleared of all clot and debris. Hemostasis was confirmed on all surfaces.  The fascia was then closed using 0 looped PDS in a running fashion.   The skin was closed with 4-0 vicryl. The patient tolerated the procedure well. Sponge, lap, instrument and needle counts were correct x 2. She was taken to the recovery room in stable condition.    Brandy HeritageJacob J Carel Carrier, DO 03/29/2016 4:10 PM

## 2016-03-29 NOTE — Anesthesia Postprocedure Evaluation (Signed)
Anesthesia Post Note  Patient: Brandy Reed  Procedure(s) Performed: Procedure(s) (LRB): CESAREAN SECTION (N/A)  Patient location during evaluation: PACU Anesthesia Type: Spinal Level of consciousness: awake Pain management: satisfactory to patient Vital Signs Assessment: post-procedure vital signs reviewed and stable Respiratory status: spontaneous breathing Cardiovascular status: blood pressure returned to baseline Postop Assessment: no headache and spinal receding Anesthetic complications: no     Last Vitals:  Vitals:   03/29/16 1445 03/29/16 1500  BP: (!) 89/58 (!) 104/51  Pulse: (!) 59 (!) 56  Resp: 18 15  Temp:      Last Pain:  Vitals:   03/29/16 1510  TempSrc:   PainSc: 4    Pain Goal: Patients Stated Pain Goal: 3 (03/29/16 1415)               Cristela BlueJACKSON,Darren Nodal EDWARD

## 2016-03-29 NOTE — Transfer of Care (Signed)
Immediate Anesthesia Transfer of Care Note  Patient: Brandy Reed  Procedure(s) Performed: Procedure(s): CESAREAN SECTION (N/A)  Patient Location: PACU  Anesthesia Type:Spinal and Epidural  Level of Consciousness: awake, alert , oriented and patient cooperative  Airway & Oxygen Therapy: Patient Spontanous Breathing  Post-op Assessment: Report given to RN and Post -op Vital signs reviewed and stable  Post vital signs: Reviewed and stable  Last Vitals:  Vitals:   03/29/16 1124  BP: (!) 139/105  Pulse: 84  Resp: 18  Temp: 36.9 C    Last Pain:  Vitals:   03/29/16 1124  TempSrc: Oral      Patients Stated Pain Goal: 3 (03/29/16 1124)  Complications: No apparent anesthesia complications

## 2016-03-29 NOTE — Anesthesia Procedure Notes (Signed)
Epidural Patient location during procedure: OB  Preanesthetic Checklist Completed: patient identified, site marked, surgical consent, pre-op evaluation, timeout performed, IV checked, risks and benefits discussed and monitors and equipment checked  Epidural Patient position: sitting Prep: site prepped and draped and DuraPrep Patient monitoring: continuous pulse ox and blood pressure Approach: midline Location: L3-L4 Injection technique: LOR air  Needle:  Needle type: Tuohy  Needle gauge: 17 G Needle length: 9 cm and 9 Needle insertion depth: 6 cm Catheter type: closed end flexible Catheter size: 19 Gauge Catheter at skin depth: 12 cm Test dose: negative  Assessment Sensory level: T5 Events: blood not aspirated, injection not painful, no injection resistance, negative IV test and no paresthesia  Additional Notes Sprotte thru touhy as CSE  Spinal Dosage in OR  Bupivicaine ml       1.5 PFMS04   mcg        100 Fentanyl mcg            25 (-) asp heme/CSF

## 2016-03-29 NOTE — Anesthesia Postprocedure Evaluation (Signed)
Anesthesia Post Note  Patient: Brandy Reed  Procedure(s) Performed: Procedure(s) (LRB): CESAREAN SECTION (N/A)  Patient location during evaluation: Mother Baby Anesthesia Type: Spinal Level of consciousness: awake and alert and oriented Pain management: satisfactory to patient Vital Signs Assessment: post-procedure vital signs reviewed and stable Respiratory status: spontaneous breathing and nonlabored ventilation Cardiovascular status: stable Postop Assessment: no headache, no backache, patient able to bend at knees, no signs of nausea or vomiting and adequate PO intake Anesthetic complications: no     Last Vitals:  Vitals:   03/29/16 1603 03/29/16 1711  BP: 131/74 130/72  Pulse: 62 60  Resp: 16 18  Temp: 36.4 C 36.4 C    Last Pain:  Vitals:   03/29/16 1927  TempSrc:   PainSc: 8    Pain Goal: Patients Stated Pain Goal: 3 (03/29/16 1530)               Madison HickmanGREGORY,[name]

## 2016-03-30 LAB — TYPE AND SCREEN
ABO/RH(D): A POS
ANTIBODY SCREEN: NEGATIVE
Unit division: 0
Unit division: 0

## 2016-03-30 LAB — CBC
HEMATOCRIT: 28.2 % — AB (ref 36.0–46.0)
HEMOGLOBIN: 9.5 g/dL — AB (ref 12.0–15.0)
MCH: 27.3 pg (ref 26.0–34.0)
MCHC: 33.7 g/dL (ref 30.0–36.0)
MCV: 81 fL (ref 78.0–100.0)
Platelets: 279 10*3/uL (ref 150–400)
RBC: 3.48 MIL/uL — AB (ref 3.87–5.11)
RDW: 14 % (ref 11.5–15.5)
WBC: 17 10*3/uL — AB (ref 4.0–10.5)

## 2016-03-30 MED ORDER — POLYSACCHARIDE IRON COMPLEX 150 MG PO CAPS
150.0000 mg | ORAL_CAPSULE | Freq: Two times a day (BID) | ORAL | Status: DC
  Administered 2016-03-30 – 2016-04-01 (×5): 150 mg via ORAL
  Filled 2016-03-30 (×5): qty 1

## 2016-03-30 NOTE — Lactation Note (Signed)
This note was copied from a baby's chart. Lactation Consultation Note Mom's 6th baby, mom BF the first 2 children, one for 1 yr, the 2nd for 4 months. Mom is breast/formula with this baby. Mom states she hasn't decided totally about BF yet, but will try. Mom had c/s, isn't feeling good d/t painful cramping. Asked mom if she had any questions or having difficulty latching. Mom stated no baby was BF well, but is sleepy. Newborn behavior educated, I&O, STS, supply and demand. Reviewed w/supplementing and BF. Mom encouraged to feed baby 8-12 times/24 hours and with feeding cues. [facility] brochure given w/resources, support groups and LC services. Mom not in a talkative mood d/t pain. Encouraged to call for questions or assistance.   Patient Name: Brandy Reed Today's Date: 03/30/2016 Reason for consult: Initial assessment   Maternal Data Has patient been taught Hand Expression?: Yes Does the patient have breastfeeding experience prior to this delivery?: Yes  Feeding    LATCH Score/Interventions                      Lactation Tools Discussed/Used WIC Program: No   Consult Status Consult Status: Follow-up Date: 03/30/16 (in pm) Follow-up type: In-patient    Naylin Burkle, Diamond NickelLAURA G 03/30/2016, 1:20 AM

## 2016-03-30 NOTE — Progress Notes (Signed)
Subjective: Postpartum Day #1: Cesarean Delivery Patient reports incisional pain, tolerating PO and no problems voiding.  Fatigue. Is having difficulty with latch for breast feeding/Lactiation consult ordered.    Objective: Vital signs in last 24 hours: Temp:  [97.6 F (36.4 C)-98.4 F (36.9 C)] 97.9 F (36.6 C) (08/22 0505) Pulse Rate:  [56-84] 61 (08/22 0000) Resp:  [15-22] 18 (08/22 0505) BP: (89-140)/(51-105) 125/60 (08/22 0000) SpO2:  [99 %-100 %] 100 % (08/22 0505)  Physical Exam:  General: alert, cooperative, fatigued and no distress Lochia: appropriate Uterine Fundus: firm Incision: no significant drainage DVT Evaluation: No evidence of DVT seen on physical exam. No cords or calf tenderness. No significant calf/ankle edema.   Recent Labs  03/30/16 0547  HGB 9.5*  HCT 28.2*    Assessment/Plan: Status post Cesarean section. Doing well postoperatively.  Continue current care. Anemia: iron started  Lactation consult ordered  Roe Coombsachelle A Nahiem Dredge, CNM 03/30/2016, 10:03 AM

## 2016-03-30 NOTE — Progress Notes (Signed)
CSW acknowledged consult for MOB.  CSW attempted to meet with MOB, however, when CSW arrived MOB and infant were being attended by medical staff.  CSW will attempt to meet with MOB at a later time.   Tracia Lacomb Boyd-Gilyard, MSW, LCSW Clinical Social Work [phone] 

## 2016-03-31 ENCOUNTER — Encounter (HOSPITAL_COMMUNITY): Payer: Self-pay | Admitting: Family Medicine

## 2016-03-31 MED ORDER — OXYCODONE HCL ER 20 MG PO T12A
40.0000 mg | EXTENDED_RELEASE_TABLET | Freq: Two times a day (BID) | ORAL | Status: DC
  Administered 2016-03-31 – 2016-04-01 (×3): 40 mg via ORAL
  Filled 2016-03-31 (×2): qty 2

## 2016-03-31 NOTE — Clinical Social Work Maternal (Signed)
  CLINICAL SOCIAL WORK MATERNAL/CHILD NOTE  Patient Details  Name: Brandy Reed MRN: [id number] Date of Birth: Nov 12, 1980  Date:  03/31/2016  Clinical Social Worker Initiating Note:  Laurey Arrow Date/ Time Initiated:  03/31/16/1622     Child's Name:  Enid Derry   Legal Guardian:  Mother   Need for Interpreter:  None   Date of Referral:  03/29/16     Reason for Referral:  Behavioral Health Issues, including SI    Referral Source:  Central Nursery   Address:  [place] [address]  Phone number:  [phone]   Household Members:  Self, Minor Children   Natural Supports (not living in the home):  Parent, Spouse/significant other, Friends, Immediate Family   Professional Supports: None   Employment: Unemployed   Type of Work:     Education:  Diplomatic Services operational officer Resources:  Medicaid   Other Resources:  Physicist, medical    Cultural/Religious Considerations Which May Impact Care:  Per 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  MOB was inviting, polite, and attentive to infant.  CSW inquired about MOB's hx of PPD and MOB openly reported that MOB experienced PPD with MOB's 3rd child (MOB has 6 children).  MOB communicated feeling of sadness, tearfulness, and loneliness.  MOB stated that MOB was prescribed medication but was uncertain the name of the mediation. MOB report inconsistent use of medication.  MOB reports symptoms went away after about 2 weeks.  CSW educated MOB about PPD. CSW reviewed signs and symptoms for PPD and encouraged MOB to ask questions. CSW informed MOB of supports and interventions to  decrease PPD.  CSW also encouraged MOB to seek medical attention if needed for increased signs and symptoms for PPD.  CSW also offered MOB resources for outpatient [place] services and MOB declined.  MOB did not have any further questions, concerns, or needs at this time.  CSW thanked MOB for allowing CSW to meet with MOB.     CSW Plan/Description:  No Further Intervention Required/No Barriers to Discharge, Patient/Family Education    Laurey Arrow, MSW, LCSW Clinical Social Work [phone]    Brandy Nanas, LCSW 03/31/2016, 4:25 PM

## 2016-03-31 NOTE — Progress Notes (Addendum)
Subjective: Postpartum Day #2: Cesarean Delivery Patient reports incisional pain, tolerating PO, + flatus, + BM and no problems voiding.  States that her pain medication wears off after 2 hours and then she is in pain in her incision and back around where her spinal was.    Objective: Vital signs in last 24 hours: Temp:  [97.8 F (36.6 C)-98.1 F (36.7 C)] 98.1 F (36.7 C) (08/23 0655) Pulse Rate:  [72-82] 82 (08/23 0655) Resp:  [18] 18 (08/23 0655) BP: (113-145)/(59-78) 145/71 (08/23 0655) SpO2:  [99 %] 99 % (08/22 1400)  Physical Exam:  General: alert, cooperative, no distress and moderately obese Lochia: appropriate Uterine Fundus: firm Incision: healing well, no significant drainage, no dehiscence, no significant erythema DVT Evaluation: No evidence of DVT seen on physical exam. No cords or calf tenderness. No significant calf/ankle edema.   Recent Labs  03/30/16 0547  HGB 9.5*  HCT 28.2*    Assessment/Plan: Status post Cesarean section. Postoperative course complicated by incisional and spinal pain  Continue current care.  Percocet discontinued.  OxyContin ER started.  Abdominal binder ordered.    Brandy Reed, CNM 03/31/2016, 7:45 AM

## 2016-03-31 NOTE — Progress Notes (Signed)
Honey comb came off at 2130 in shower, new honey comb applied by [employer]

## 2016-03-31 NOTE — Progress Notes (Signed)
CSW attempted to meet with MOB, however, MOB was resting and requested CSW to return at a later time.    Olliver Boyadjian Boyd-Gilyard, MSW, LCSW Clinical Social Work [phone]  

## 2016-03-31 NOTE — Lactation Note (Addendum)
This note was copied from a baby's chart. Lactation Consultation Note  Patient Name: Brandy Reed Today's Date: 03/31/2016 Reason for consult: Follow-up assessment;Difficult latch  Baby 47 hours old. Mom reports that she has been giving the baby bottles of formula--30 - 40 ml last 2 bottles--but she is wanting to breastfeed this baby. Mom feeding the baby a bottle when this LC entered the room, and baby has taken 20 ml. Baby very fussy when bottle removed. Assisted mom to latch baby to left breast in football position. Baby fussy at breast and will not open her mouth to latch. Baby is sucking her tongue and attempting to nibble her way onto the breast. Discussed with mom that the baby is used to the flow of the bottle, so discussed attempting an SNS so baby can nurse at breast. Mom agreed and SNS attempted. However, baby still not willing to open her mouth to latch. Baby vigorously cueing to eat and mom cramping and sleepy from her pain medications--and states that she needs a break. Baby given another 10 ml of formula by bottle, and set mom up with DEBP. Mom stated that she has pumped with other children.  Enc mom to provide finger/suck training to enc baby to stop sucking her tongue, and demonstrated how to perform. Enc mom to put baby to breast with cues, and then supplement as needed. Enc mom to use DEBP ever 2-3 hours to get her milk to come to volume. Discussed BF assessment and interventions and mom's c/o pain/cramping with patient's bedside nurse, Efraim KaufmannMelissa, RN.   Maternal Data    Feeding Feeding Type: Formula Length of feed: 0 min  LATCH Score/Interventions Latch: Too sleepy or reluctant, no latch achieved, no sucking elicited. Intervention(s): Skin to skin Intervention(s): Assist with latch;Adjust position;Breast compression  Audible Swallowing: None Intervention(s): Skin to skin;Hand expression  Type of Nipple: Everted at rest and after stimulation  Comfort  (Breast/Nipple): Soft / non-tender     Hold (Positioning): Assistance needed to correctly position infant at breast and maintain latch. Intervention(s): Breastfeeding basics reviewed;Support Pillows;Position options;Skin to skin  LATCH Score: 5  Lactation Tools Discussed/Used Tools: Pump;Supplemental Nutrition System Breast pump type: Double-Electric Breast Pump Pump Review: Setup, frequency, and cleaning;Milk Storage Initiated by:: JW Date initiated:: 03/31/16   Consult Status Consult Status: Follow-up Date: 04/01/16 Follow-up type: In-patient    Brandy Reed 03/31/2016, 12:22 PM

## 2016-04-01 MED ORDER — OXYCODONE-ACETAMINOPHEN 5-325 MG PO TABS: 1.0000 | ORAL_TABLET | ORAL | 0 refills | Status: DC | PRN

## 2016-04-01 MED ORDER — IBUPROFEN 600 MG PO TABS: 600.0000 mg | ORAL_TABLET | Freq: Four times a day (QID) | ORAL | 2 refills | Status: DC

## 2016-04-01 MED ORDER — SENNOSIDES-DOCUSATE SODIUM 8.6-50 MG PO TABS: 2.0000 | ORAL_TABLET | Freq: Two times a day (BID) | ORAL | 2 refills | Status: DC

## 2016-04-01 MED ORDER — POLYSACCHARIDE IRON COMPLEX 150 MG PO CAPS: 150.0000 mg | ORAL_CAPSULE | Freq: Two times a day (BID) | ORAL | 2 refills | Status: DC

## 2016-04-01 MED ORDER — COCONUT OIL OIL: 1.0000 "application " | TOPICAL_OIL | 99 refills | Status: DC | PRN

## 2016-04-01 NOTE — Progress Notes (Addendum)
Subjective: Postpartum Day #3: Cesarean Delivery Patient reports incisional pain, tolerating PO, + flatus, + BM and no problems voiding.    Objective: Vital signs in last 24 hours: Temp:  [97.5 F (36.4 C)-98.1 F (36.7 C)] 97.5 F (36.4 C) (08/24 0533) Pulse Rate:  [67-73] 73 (08/24 0533) Resp:  [18] 18 (08/24 0533) BP: (120-139)/(65-74) 120/65 (08/24 0533) Weight:  [192 lb (87.1 kg)] 192 lb (87.1 kg) (08/23 1500)  Physical Exam:  General: alert, cooperative and no distress Lochia: appropriate Uterine Fundus: firm Incision: healing well, no significant drainage, no dehiscence, no significant erythema DVT Evaluation: No evidence of DVT seen on physical exam. Negative Homan's sign. No cords or calf tenderness. No significant calf/ankle edema.   Recent Labs  03/30/16 0547  HGB 9.5*  HCT 28.2*    Assessment/Plan: Status post Cesarean section. Doing well postoperatively.  Discharge home with standard precautions, follow up in office next week for a wound check/postpartum exam.  Planning Nexplanon/or IUD postpartum.    Brandy Reed, CNM 04/01/2016, 8:50 AM

## 2016-04-01 NOTE — Discharge Summary (Signed)
Obstetric Discharge Summary Reason for Admission: cesarean section Prenatal Procedures: NST and ultrasound Intrapartum Procedures: cesarean: low cervical, transverse and repeat X5 Postpartum Procedures: none Complications-Operative and Postpartum: none Hemoglobin  Date Value Ref Range Status  03/30/2016 9.5 (L) 12.0 - 15.0 g/dL Final  45/40/981104/07/2016 91.410.7 g/dL Final   HCT  Date Value Ref Range Status  03/30/2016 28.2 (L) 36.0 - 46.0 % Final  11/19/2015 32 % Final   Hematocrit  Date Value Ref Range Status  03/03/2016 31.7 (L) 34.0 - 46.6 % Final    Physical Exam:  General: alert, cooperative and no distress Lochia: appropriate Uterine Fundus: firm Incision: healing well, no significant drainage, no dehiscence, no significant erythema DVT Evaluation: No evidence of DVT seen on physical exam. No cords or calf tenderness. No significant calf/ankle edema.  Discharge Diagnoses: Term Pregnancy-delivered and Repeat LTCS  Discharge Information: Date: 04/01/2016 Activity: pelvic rest Diet: routine Medications: PNV, Ibuprofen, Colace, Iron and Percocet Condition: stable Instructions: refer to practice specific booklet Discharge to: home Follow-up Information    Roe Coombsachelle A Juanell Saffo, CNM. Schedule an appointment as soon as possible for a visit in 1 week(s).   Specialty:  Certified Nurse Midwife Why:  post op exam/postpartum Contact information: [address] [place] [address] [phone]           Newborn Data: Live born female  Birth Weight: 7 lb 12.5 oz (3530 g) APGAR: 8, 9  Home with mother.  Gwendlyn Hanback A Taytum Scheck,CNM 04/01/2016, 8:59 AM

## 2016-04-01 NOTE — Lactation Note (Signed)
This note was copied from a baby's chart. Lactation Consultation Note  Patient Name: Brandy Reed Today's Date: 04/01/2016 Reason for consult: Follow-up assessment;Difficult latch   Follow up witn mom of 50 hour old infant. Infant with 1 BF for 10 minutes, 2 bottles of EBM of 50-65 cc, 6 bottles of formula of 10-60 cc, 9 voids and 9 stools in 24 hours prior to this assessment. Infant weight 7 lb 4.6 oz with weight loss of 6%.  Mom reports she feels infant is latching better but she is giving bottle of EBM/Formula. She declined assistance with latch today. She pumped x 3 in the last 24 hours. Enc her to pump every 2-3 hours for 15 minutes. Mom asked about a pump rental, discussed 2 week rental and then mom can apply to [facility] and inquire about a pump. She declined. She was shown how to use single and double pumps in breast pump kit. She voiced understanding.   Reviewed Engorgement prevention/treatment with mom. Reviewed BM Storage.  [facility] Brochure reviewed, mom aware of OP services, BF Support Groups and [place] phone #. Enc mom to call with questions/concerns prn. She has no questions at this time.    Maternal Data Formula Feeding for Exclusion: Yes Reason for exclusion: Mother's choice to formula and breast feed on admission  Feeding Feeding Type: Bottle Fed - Breast Milk Nipple Type: Slow - flow  LATCH Score/Interventions                      Lactation Tools Discussed/Used WIC Program: No (Plans to apply) Pump Review: Setup, frequency, and cleaning;Milk Storage   Consult Status Consult Status: Complete Follow-up type: Call as needed    Donn Pierini 04/01/2016, 12:07 PM

## 2016-04-05 ENCOUNTER — Ambulatory Visit (INDEPENDENT_AMBULATORY_CARE_PROVIDER_SITE_OTHER): Payer: Medicaid Other | Admitting: Obstetrics & Gynecology

## 2016-04-05 ENCOUNTER — Ambulatory Visit: Payer: Medicaid Other | Admitting: Obstetrics & Gynecology

## 2016-04-05 VITALS — BP 171/102 | HR 76 | Temp 98.6°F | Wt 191.4 lb

## 2016-04-05 DIAGNOSIS — O8600 Infection of obstetric surgical wound, unspecified: Secondary | ICD-10-CM

## 2016-04-05 MED ORDER — OXYCODONE HCL 5 MG PO TABS: 5.0000 mg | ORAL_TABLET | ORAL | 0 refills | Status: DC | PRN

## 2016-04-05 MED ORDER — TERBINAFINE HCL 1 % EX CREA: TOPICAL_CREAM | Freq: Two times a day (BID) | CUTANEOUS | Status: DC

## 2016-04-05 NOTE — Progress Notes (Signed)
Subjective:     Brandy Reed is a 35 y.o. female who presents for a postpartum visit. She is 1 week postpartum following a low cervical transverse Cesarean section. I have fully reviewed the prenatal and intrapartum course. The delivery was at 39.2  gestational weeks. Outcome: repeat cesarean section, low transverse incision. Anesthesia: spinal. Postpartum course has been complicated by apin and rash on buttocks. Baby's course has been normal. Baby is feeding by breast. Bleeding red. Bowel function is normal. Bladder function is dysuria. Patient is not sexually active. Contraception method is abstinence. Has almost run out of 50 Percocet in 4 days  The following portions of the patient's history were reviewed and updated as appropriate: allergies, current medications, past family history, past medical history, past social history, past surgical history and problem list.  Review of Systems Pertinent items are noted in HPI.   Objective:    BP (!) 171/102   Pulse 76   Temp 98.6 F (37 C) (Oral)   Wt 191 lb 6.4 oz (86.8 kg)   LMP 05/28/2015 (Approximate)   BMI 32.85 kg/m   General:  alert, cooperative and no distress            Abdomen: Incision ok   Vulva:  dry scaly rash possible yeast  Vagina: normal vagina  Cervix:     Corpus: not examined  Adnexa:  not evaluated  Rectal Exam: Not performed.        Assessment:     1 week postpartum exam. Rash seems to be yeast dermatitis  Plan:   Lamisil cream to affected area 2. RTC 4 weeks Adam PhenixJames G Asyah Candler, MD

## 2016-04-09 ENCOUNTER — Ambulatory Visit (HOSPITAL_COMMUNITY): Payer: Medicaid Other

## 2016-04-28 ENCOUNTER — Encounter: Payer: Self-pay | Admitting: Certified Nurse Midwife

## 2016-04-28 ENCOUNTER — Ambulatory Visit (INDEPENDENT_AMBULATORY_CARE_PROVIDER_SITE_OTHER): Payer: Medicaid Other | Admitting: Certified Nurse Midwife

## 2016-04-28 VITALS — BP 160/110 | HR 78 | Wt 184.0 lb

## 2016-04-28 DIAGNOSIS — N390 Urinary tract infection, site not specified: Secondary | ICD-10-CM

## 2016-04-28 DIAGNOSIS — R030 Elevated blood-pressure reading, without diagnosis of hypertension: Principal | ICD-10-CM

## 2016-04-28 DIAGNOSIS — O8600 Infection of obstetric surgical wound, unspecified: Secondary | ICD-10-CM

## 2016-04-28 DIAGNOSIS — R399 Unspecified symptoms and signs involving the genitourinary system: Secondary | ICD-10-CM

## 2016-04-28 DIAGNOSIS — IMO0001 Reserved for inherently not codable concepts without codable children: Secondary | ICD-10-CM

## 2016-04-28 LAB — POCT URINALYSIS DIPSTICK
Bilirubin, UA: NEGATIVE
Blood, UA: 250
Glucose, UA: NEGATIVE
KETONES UA: NEGATIVE
LEUKOCYTES UA: NEGATIVE
Nitrite, UA: POSITIVE
PH UA: 6
SPEC GRAV UA: 1.015
UROBILINOGEN UA: NEGATIVE

## 2016-04-28 MED ORDER — CARVEDILOL 25 MG PO TABS: 25.0000 mg | ORAL_TABLET | Freq: Two times a day (BID) | ORAL | 2 refills | Status: DC

## 2016-04-28 MED ORDER — PHENAZOPYRIDINE HCL 200 MG PO TABS: 200.0000 mg | ORAL_TABLET | Freq: Three times a day (TID) | ORAL | 0 refills | Status: DC | PRN

## 2016-04-28 MED ORDER — OXYCODONE HCL 5 MG PO TABS: 5.0000 mg | ORAL_TABLET | ORAL | 0 refills | Status: DC | PRN

## 2016-04-28 MED ORDER — NITROFURANTOIN MONOHYD MACRO 100 MG PO CAPS: 100.0000 mg | ORAL_CAPSULE | Freq: Two times a day (BID) | ORAL | 0 refills | Status: AC

## 2016-04-28 NOTE — Progress Notes (Signed)
Patient ID: Brandy Reed, female   DOB: 02-25-1981, 35 y.o.   MRN: [id number]  Chief Complaint  Patient presents with  . Wound Check    c/s check    HPI Brandy Reed is a 35 y.o. female.  She is 4 weeks postpartum from a repeat C-section.  She reports urinating difficulty.  Denies any HA, epigastric pain, or edema.  Reports incisional pain.  Is out of pain medication; states she takes it a few times a day.  Discussed elevated blood pressure and precautions.  Lab work today for preeclampsia.  Started on Coreg.  Patient verbalized understanding.  Discussed cutting back on tobacco, had quit during pregnancy.    HPI  Past Medical History:  Diagnosis Date  . Depression    postpartum  . Headache(784.0)   . PONV (postoperative nausea and vomiting)   . Sinus disorder    sometimes gets HA's    Past Surgical History:  Procedure Laterality Date  . CESAREAN SECTION  X 3  . CESAREAN SECTION N/A 07/07/2014   Procedure: CESAREAN SECTION;  Surgeon: Kathreen Cosier, MD;  Location: [facility]S;  Service: Obstetrics;  Laterality: N/A;  . CESAREAN SECTION N/A 03/29/2016   Procedure: CESAREAN SECTION;  Surgeon: Levie Heritage, DO;  Location: [facility] BIRTHING SUITES;  Service: Obstetrics;  Laterality: N/A;  . DILATION AND CURETTAGE OF UTERUS    . NOSE SURGERY    . WISDOM TOOTH EXTRACTION      No family history on file.  Social History Social History  Substance Use Topics  . Smoking status: Current Every Day Smoker    Packs/day: 0.25    Types: Cigars  . Smokeless tobacco: Never Used  . Alcohol use No    No Known Allergies  Current Outpatient Prescriptions  Medication Sig Dispense Refill  . ibuprofen (ADVIL,MOTRIN) 600 MG tablet Take 1 tablet (600 mg total) by mouth every 6 (six) hours. 120 tablet 2  . iron polysaccharides (NIFEREX) 150 MG capsule Take 1 capsule (150 mg total) by mouth 2 (two) times daily. 60 capsule 2  . Prenat w/o A-FeCbn-Meth-FA-DHA (PRENATE MINI) 29-0.6-0.4-350 MG  CAPS Take 1 capsule by mouth daily before breakfast. 90 capsule 3  . senna-docusate (SENOKOT-S) 8.6-50 MG tablet Take 2 tablets by mouth 2 (two) times daily. 90 tablet 2  . carvedilol (COREG) 25 MG tablet Take 1 tablet (25 mg total) by mouth 2 (two) times daily with a meal. 60 tablet 2  . coconut oil OIL Apply 1 application topically as needed. 500 mL PRN  . nitrofurantoin, macrocrystal-monohydrate, (MACROBID) 100 MG capsule Take 1 capsule (100 mg total) by mouth 2 (two) times daily. 14 capsule 0  . oxyCODONE (ROXICODONE) 5 MG immediate release tablet Take 1 tablet (5 mg total) by mouth every 4 (four) hours as needed for severe pain. 30 tablet 0  . oxyCODONE-acetaminophen (ROXICET) 5-325 MG tablet Take 1-2 tablets by mouth every 4 (four) hours as needed for severe pain. (Patient not taking: Reported on 04/28/2016) 50 tablet 0  . phenazopyridine (PYRIDIUM) 200 MG tablet Take 1 tablet (200 mg total) by mouth 3 (three) times daily as needed for pain. 30 tablet 0   Current Facility-Administered Medications  Medication Dose Route Frequency Provider Last Rate Last Dose  . terbinafine (LAMISIL) 1 % cream   Topical BID Adam Phenix, MD        Review of Systems Review of Systems Constitutional: negative for fatigue and weight loss Respiratory: negative for cough and  wheezing Cardiovascular: negative for chest pain, fatigue and palpitations Gastrointestinal: negative for abdominal pain and change in bowel habits Genitourinary:negative Integument/breast: negative for nipple discharge Musculoskeletal:negative for myalgias Neurological: negative for gait problems and tremors Behavioral/Psych: negative for abusive relationship, depression Endocrine: negative for temperature intolerance     Blood pressure (!) 160/110, pulse 78, weight 184 lb (83.5 kg), unknown if currently breastfeeding.  Physical Exam Physical Exam General:   alert  Skin:   no rash or abnormalities  Lungs:   clear to auscultation  bilaterally  Heart:   regular rate and rhythm, S1, S2 normal, no murmur, click, rub or gallop  Breasts:   deferred  Abdomen:  normal findings: no organomegaly, soft, non-tender and no hernia  No CVA tenderness noted  C-section incision well approximated, no erythema or s/s infection    50% of 25 min visit spent on counseling and coordination of care.   Data Reviewed Previous medical hx, meds, labs  Assessment     UTI Pain in recent incision  Elevated blood pressure  Plan    Orders Placed This Encounter  Procedures  . Urine culture  . Lactate dehydrogenase  . Creatinine, serum  . CBC  . ALT  . AST  . Pathologist smear review  . Protein / creatinine ratio, urine  . POCT urinalysis dipstick   Meds ordered this encounter  Medications  . carvedilol (COREG) 25 MG tablet    Sig: Take 1 tablet (25 mg total) by mouth 2 (two) times daily with a meal.    Dispense:  60 tablet    Refill:  2  . oxyCODONE (ROXICODONE) 5 MG immediate release tablet    Sig: Take 1 tablet (5 mg total) by mouth every 4 (four) hours as needed for severe pain.    Dispense:  30 tablet    Refill:  0  . nitrofurantoin, macrocrystal-monohydrate, (MACROBID) 100 MG capsule    Sig: Take 1 capsule (100 mg total) by mouth 2 (two) times daily.    Dispense:  14 capsule    Refill:  0  . phenazopyridine (PYRIDIUM) 200 MG tablet    Sig: Take 1 tablet (200 mg total) by mouth 3 (three) times daily as needed for pain.    Dispense:  30 tablet    Refill:  0    Possible management options include: MAU assessment for elevated blood pressure,  Follow up in one week for B/P check.

## 2016-04-29 LAB — CBC
HEMATOCRIT: 37.2 % (ref 34.0–46.6)
Hemoglobin: 12.1 g/dL (ref 11.1–15.9)
MCH: 26.8 pg (ref 26.6–33.0)
MCHC: 32.5 g/dL (ref 31.5–35.7)
MCV: 82 fL (ref 79–97)
Platelets: 395 10*3/uL — ABNORMAL HIGH (ref 150–379)
RBC: 4.52 x10E6/uL (ref 3.77–5.28)
RDW: 13.8 % (ref 12.3–15.4)
WBC: 7.9 10*3/uL (ref 3.4–10.8)

## 2016-04-29 LAB — CREATININE, SERUM
Creatinine, Ser: 0.81 mg/dL (ref 0.57–1.00)
GFR calc Af Amer: 109 mL/min/{1.73_m2} (ref >59)
GFR calc non Af Amer: 94 mL/min/{1.73_m2} (ref >59)

## 2016-04-29 LAB — PROTEIN / CREATININE RATIO, URINE
Creatinine, Urine: 140.7 mg/dL
Protein, Ur: 48.2 mg/dL
Protein/Creat Ratio: 343 mg/g creat — ABNORMAL HIGH (ref 0–200)

## 2016-04-29 LAB — LACTATE DEHYDROGENASE: LDH: 153 IU/L (ref 119–226)

## 2016-04-29 LAB — AST: AST: 17 IU/L (ref 0–40)

## 2016-04-29 LAB — ALT: ALT: 10 IU/L (ref 0–32)

## 2016-05-01 LAB — URINE CULTURE

## 2016-05-02 ENCOUNTER — Other Ambulatory Visit: Payer: Self-pay | Admitting: Certified Nurse Midwife

## 2016-05-03 ENCOUNTER — Ambulatory Visit: Payer: Medicaid Other | Admitting: Family

## 2016-05-05 LAB — PATHOLOGIST SMEAR REVIEW
PATH REV RBC: NORMAL
PATH REV WBC: NORMAL

## 2016-09-20 IMAGING — US US MFM OB DETAIL+14 WK
1 series · 14 of 28 positions shown · non-contrast
Comparison: none

[Series 1: us mfm ob detail+14 wk · 63 acquisitions, 14 frames shown]
[im 3/63]
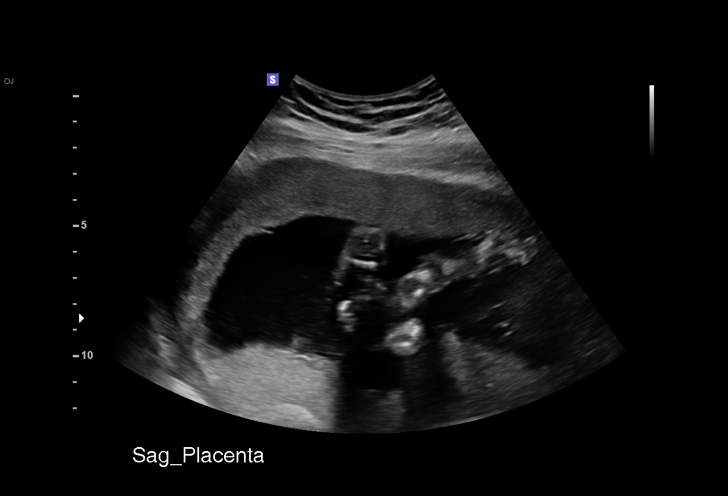
[im 7/63]
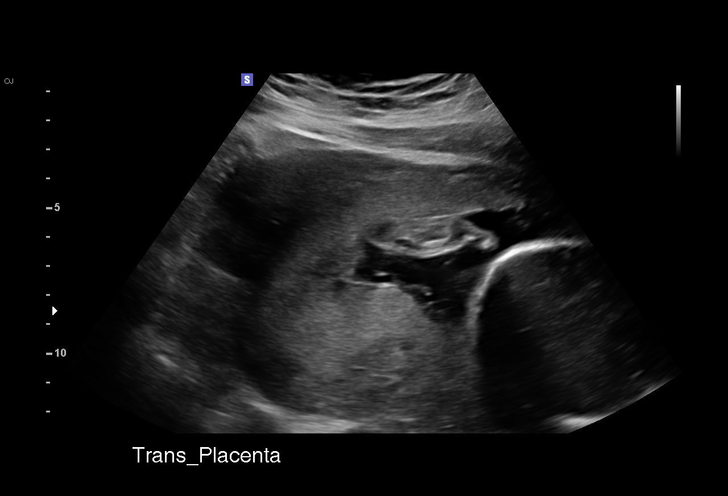
[im 12/63]
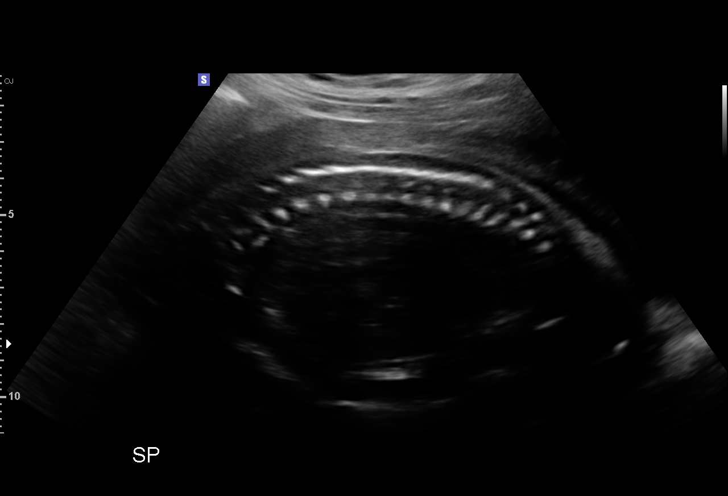
[im 17/63]
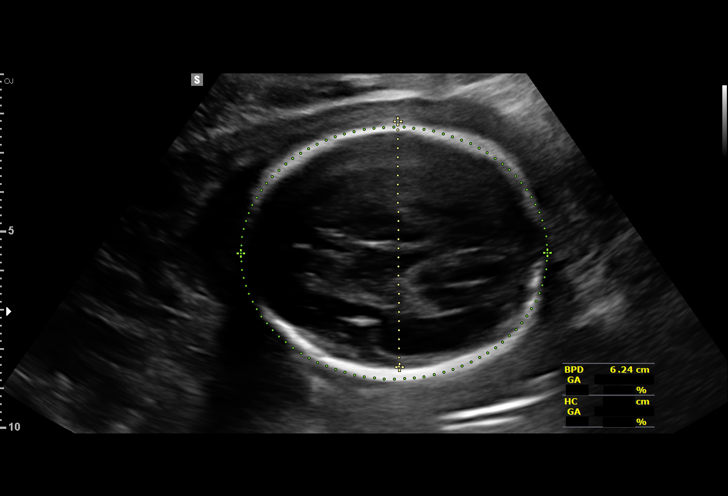
[im 21/63]
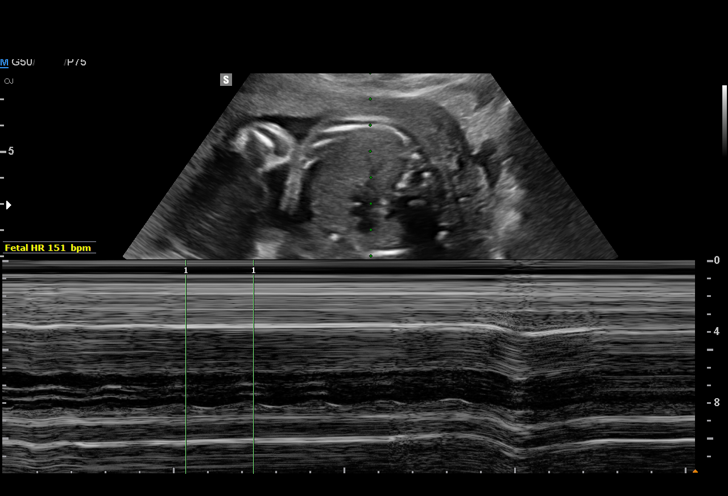
[im 26/63]
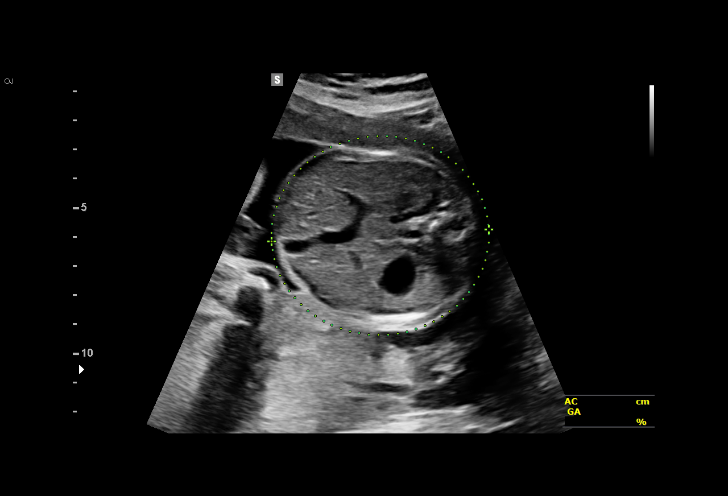
[im 30/63]
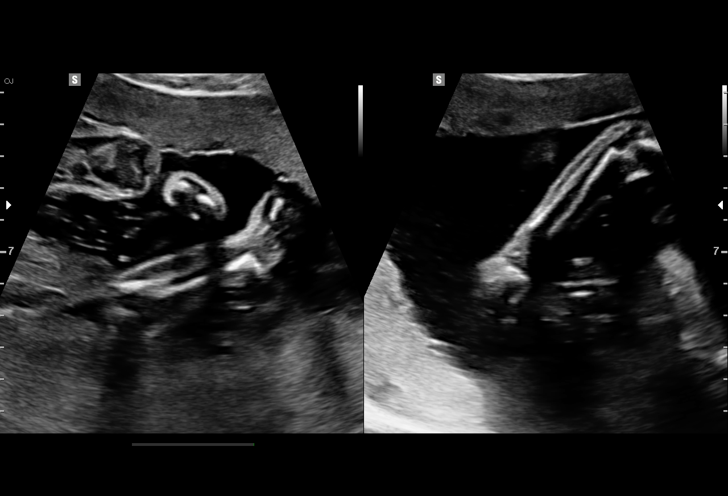
[im 35/63]
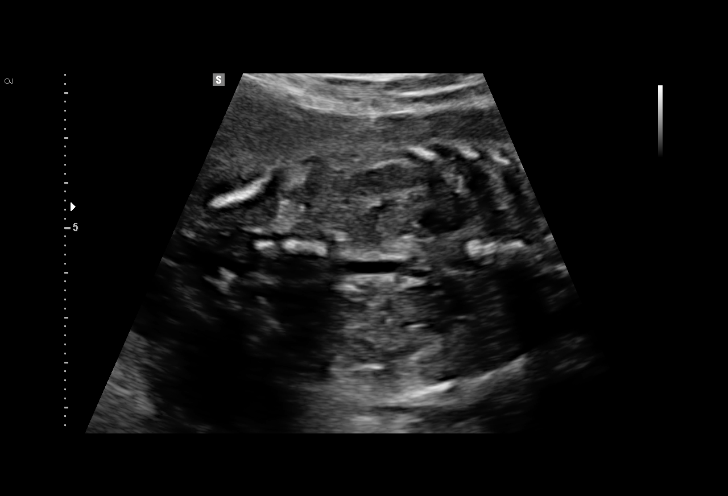
[im 40/63]
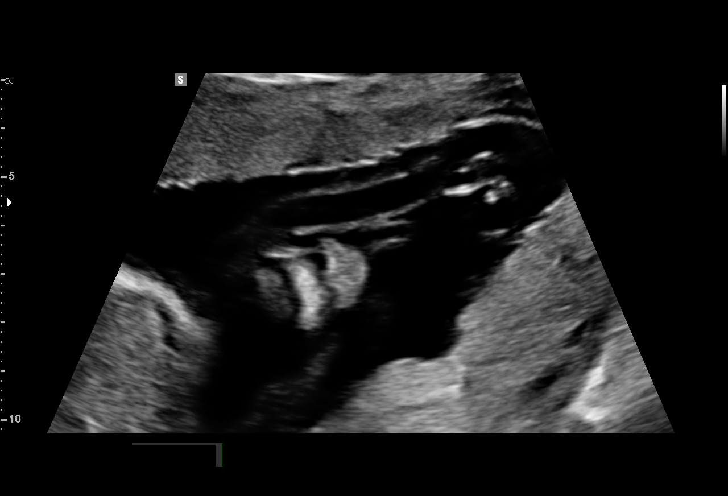
[im 44/63]
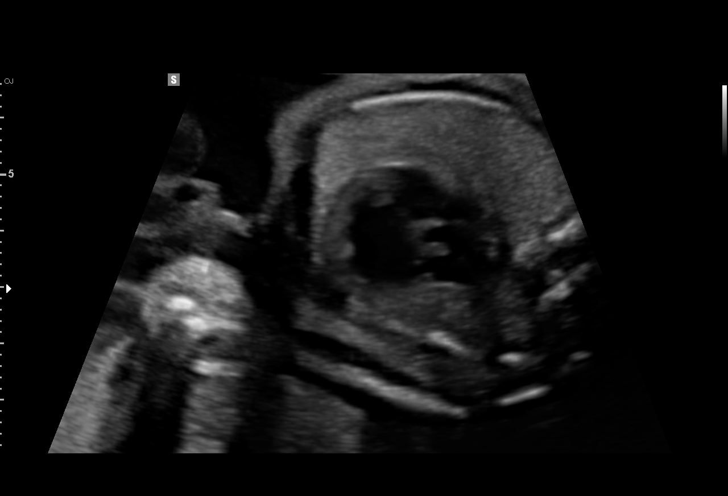
[im 49/63]
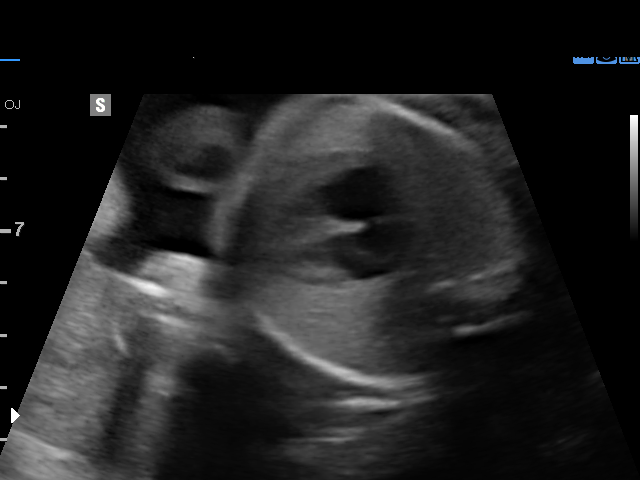
[im 53/63]
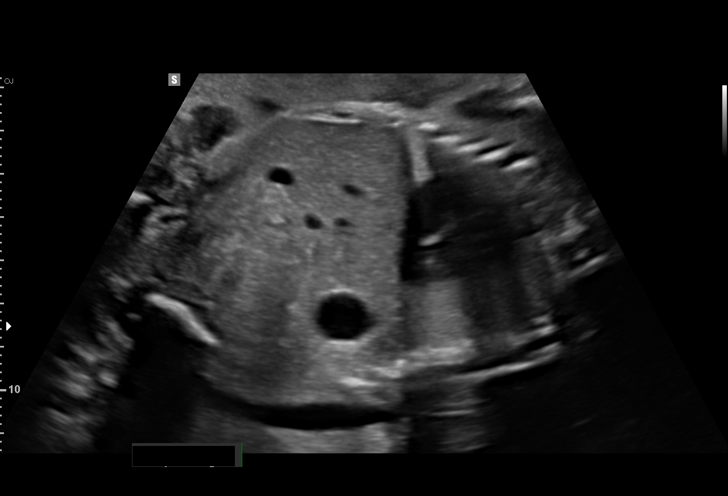
[im 58/63]
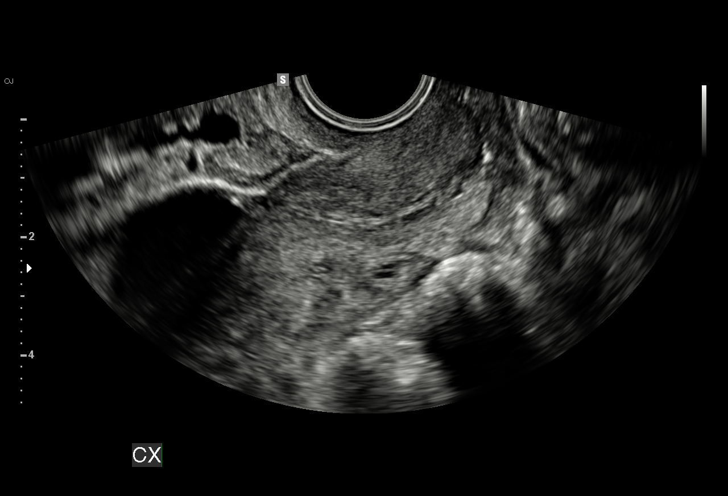
[im 63/63]
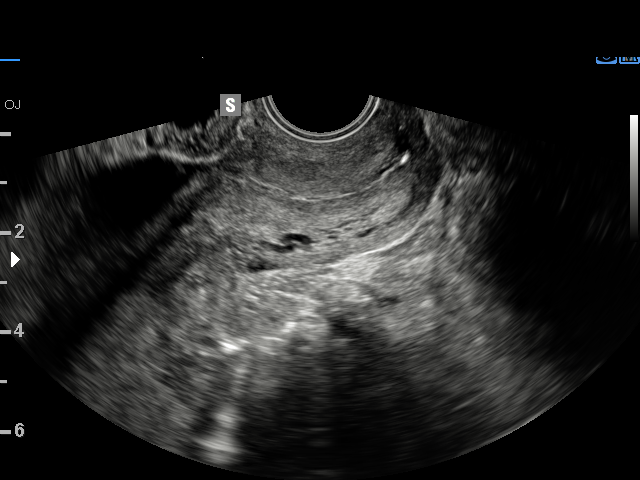

[14 of 28 positions shown; findings below may reference images not displayed]

1  CONY PARTAIN           [id number]      [id number]     [id number]
2  CONY PARTAIN           [id number]      [id number]     [id number]
Indications

Detailed fetal anatomic survey                 Z36
24 weeks gestation of pregnancy
Advanced maternal age multigravida 34+,
second trimester
OB History

Blood Type:            Height:  5'4"   Weight (lb):  186      BMI:
Gravidity:    7         Term:   5        Prem:   0        SAB:   1
TOP:          0       Ectopic:  0        Living: 5
Fetal Evaluation

Num Of Fetuses:     1
Fetal Heart         151
Rate(bpm):
Cardiac Activity:   Observed
Presentation:       Cephalic
Placenta:           Right lateral, low-lying
P. Cord Insertion:  Visualized

Amniotic Fluid
AFI FV:      Subjectively within normal limits

Largest Pocket(cm)
3.7
Biometry
BPD:      62.5  mm     G. Age:  25w 2d         65  %    CI:        77.58   %   70 - 86
FL/HC:      21.8   %   18.7 -
HC:      224.6  mm     G. Age:  24w 3d         23  %    HC/AC:      1.02       1.04 -
AC:      220.2  mm     G. Age:  26w 3d         88  %    FL/BPD:     78.4   %   71 - 87
FL:         49  mm     G. Age:  26w 4d         86  %    FL/AC:      22.3   %   20 - 24
HUM:      46.9  mm     G. Age:  27w 4d       > 95  %
CER:      27.8  mm     G. Age:  24w 6d         55  %
CM:        7.2  mm

Est. FW:     906  gm          2 lb      77  %
Gestational Age

Clinical EDD:  24w 5d                                        EDD:   04/03/16
U/S Today:     25w 5d                                        EDD:   03/27/16
Best:          24w 5d    Det. By:   Clinical EDD             EDD:   04/03/16
Anatomy

Cranium:               Appears normal         Aortic Arch:            Not well visualized
Cavum:                 Appears normal         Ductal Arch:            Not well visualized
Ventricles:            Appears normal         Diaphragm:              Appears normal
Choroid Plexus:        Appears normal         Stomach:                Appears normal, left
sided
Cerebellum:            Appears normal         Abdomen:                Appears normal
Posterior Fossa:       Appears normal         Abdominal Wall:         Not well visualized
Nuchal Fold:           Not applicable (>20    Cord Vessels:           Appears normal (3
wks GA)                                        vessel cord)
Face:                  Orbits nl; profile not Kidneys:                Appear normal
well visualized
Lips:                  Appears normal         Bladder:                Appears normal
Thoracic:              Appears normal         Spine:                  Limited views
appear normal
Heart:                 Appears normal         Upper Extremities:      Appears normal
(4CH, axis, and
situs)
RVOT:                  Appears normal         Lower Extremities:      Appears normal
LVOT:                  Appears normal

Other:  Fetus appears to be a female. Heels visualized. Nasal bone
visualized. Technically difficult due to fetal position.
Cervix Uterus Adnexa

Cervix
Length:            3.6  cm.
Normal appearance by transabdominal scan.

Left Ovary
Within normal limits.

Right Ovary
Within normal limits.
Impression

SIUP at 24+5 weeks
Normal detailed fetal anatomy; limited views of arches, CI,
spine and profile
Normal amniotic fluid volume
Measurements consistent with patient's reported EDC; EFW
at the 77th %tile
EV views of cervix: normal length without funneling; LUS
contraction
Right lateral low-lying placenta
(pt left CMFC before being seen)
Recommendations

Follow-up ultrasound in 4-6 weeks to complete anatomy
survey and reassess placental location
Genetic counseling scheduled
Need documentation for EDC of [DATE]

## 2017-07-09 ENCOUNTER — Emergency Department (HOSPITAL_COMMUNITY)
Admission: EM | Admit: 2017-07-09 | Discharge: 2017-07-09 | Discharge status: Against Medical Advice | Payer: Medicaid Other | Attending: Emergency Medicine | Admitting: Emergency Medicine

## 2017-07-09 ENCOUNTER — Encounter (HOSPITAL_COMMUNITY): Payer: Self-pay | Admitting: *Deleted

## 2017-07-09 ENCOUNTER — Emergency Department (HOSPITAL_COMMUNITY): Payer: Medicaid Other

## 2017-07-09 ENCOUNTER — Other Ambulatory Visit: Payer: Self-pay

## 2017-07-09 DIAGNOSIS — M549 Dorsalgia, unspecified: Secondary | ICD-10-CM | POA: Diagnosis not present

## 2017-07-09 DIAGNOSIS — M542 Cervicalgia: Secondary | ICD-10-CM | POA: Insufficient documentation

## 2017-07-09 DIAGNOSIS — S199XXA Unspecified injury of neck, initial encounter: Secondary | ICD-10-CM | POA: Diagnosis present

## 2017-07-09 DIAGNOSIS — Z79899 Other long term (current) drug therapy: Secondary | ICD-10-CM | POA: Insufficient documentation

## 2017-07-09 DIAGNOSIS — I10 Essential (primary) hypertension: Secondary | ICD-10-CM | POA: Insufficient documentation

## 2017-07-09 DIAGNOSIS — F17290 Nicotine dependence, other tobacco product, uncomplicated: Secondary | ICD-10-CM | POA: Insufficient documentation

## 2017-07-09 DIAGNOSIS — Y939 Activity, unspecified: Secondary | ICD-10-CM | POA: Diagnosis not present

## 2017-07-09 DIAGNOSIS — Y92410 Unspecified street and highway as the place of occurrence of the external cause: Secondary | ICD-10-CM | POA: Insufficient documentation

## 2017-07-09 DIAGNOSIS — Y999 Unspecified external cause status: Secondary | ICD-10-CM | POA: Insufficient documentation

## 2017-07-09 HISTORY — DX: Essential (primary) hypertension: I10

## 2017-07-09 LAB — POC URINE PREG, ED: PREG TEST UR: NEGATIVE

## 2017-07-09 MED ORDER — HYDROCODONE-ACETAMINOPHEN 5-325 MG PO TABS
1.0000 | ORAL_TABLET | Freq: Once | ORAL | Status: AC
  Administered 2017-07-09: 1 via ORAL
  Filled 2017-07-09: qty 1
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
## 2017-07-09 NOTE — ED Notes (Signed)
Called XRAY to notify patient is ready for transport
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
## 2017-07-09 NOTE — ED Notes (Signed)
Patient transported to X-ray 

## 2017-07-09 NOTE — ED Notes (Signed)
Pt notified that we need a urine sample before we can do the other tests, she acknowledged. Pt unhooked herself from the monitoring equipment

## 2017-07-09 NOTE — ED Notes (Signed)
Pt keeps requesting access to a microwave stating "She is hungry and needs something on her stomach. I haven't ate all day." RN stated she needed to wait until provider sees her

## 2017-12-14 ENCOUNTER — Other Ambulatory Visit: Payer: Self-pay

## 2017-12-14 ENCOUNTER — Encounter (INDEPENDENT_AMBULATORY_CARE_PROVIDER_SITE_OTHER): Payer: Self-pay | Admitting: Nurse Practitioner

## 2017-12-14 ENCOUNTER — Ambulatory Visit (INDEPENDENT_AMBULATORY_CARE_PROVIDER_SITE_OTHER): Payer: Medicaid Other | Admitting: Nurse Practitioner

## 2017-12-14 VITALS — BP 159/76 | HR 82 | Temp 98.0°F | Ht 62.0 in | Wt 187.6 lb

## 2017-12-14 DIAGNOSIS — J301 Allergic rhinitis due to pollen: Secondary | ICD-10-CM

## 2017-12-14 DIAGNOSIS — I10 Essential (primary) hypertension: Secondary | ICD-10-CM

## 2017-12-14 DIAGNOSIS — F17200 Nicotine dependence, unspecified, uncomplicated: Secondary | ICD-10-CM

## 2017-12-14 MED ORDER — LORATADINE 10 MG PO TABS: 10.0000 mg | ORAL_TABLET | Freq: Every day | ORAL | 11 refills | Status: AC

## 2017-12-14 MED ORDER — AZELASTINE HCL 0.1 % NA SOLN: 2.0000 | Freq: Two times a day (BID) | NASAL | 12 refills | Status: DC

## 2017-12-14 MED ORDER — AMLODIPINE BESYLATE 5 MG PO TABS: 5.0000 mg | ORAL_TABLET | Freq: Every day | ORAL | 3 refills | Status: AC

## 2017-12-14 MED ORDER — OLOPATADINE HCL 0.2 % OP SOLN
1.0000 [drp] | Freq: Every day | OPHTHALMIC | 1 refills | Status: AC
Start: 2017-12-14 — End: 2018-01-13

## 2017-12-14 NOTE — Progress Notes (Signed)
Assessment & Plan:  Brandy Reed was seen today for new patient (initial visit).  Diagnoses and all orders for this visit:  Essential hypertension -     amLODipine (NORVASC) 5 MG tablet; Take 1 tablet (5 mg total) by mouth daily. Continue all antihypertensives as prescribed.  Remember to bring in your blood pressure log with you for your follow up appointment.  DASH/Mediterranean Diets are healthier choices for HTN.    Tobacco dependence Brandy Reed was counseled on the dangers of tobacco use, and was advised to quit. Reviewed strategies to maximize success, including removing cigarettes and smoking materials from environment, stress management and support of family/friends as well as pharmacological alternatives including: Wellbutrin, Chantix, Nicotine patch, Nicotine gum or lozenges. Smoking cessation support: smoking cessation hotline: 1-800-QUIT-NOW.  Smoking cessation classes are also available through [facility]. Call 6200302767 or visit our website at [email or web address].   A total of 3 minutes was spent on counseling for smoking cessation and Brandy Reed is not ready to quit.   Seasonal allergic rhinitis due to pollen -     loratadine (CLARITIN) 10 MG tablet; Take 1 tablet (10 mg total) by mouth daily. -     azelastine (ASTELIN) 0.1 % nasal spray; Place 2 sprays into both nostrils 2 (two) times daily. Use in each nostril as directed -     Olopatadine HCl (PATADAY) 0.2 % SOLN; Apply 1 drop to eye daily.    Patient has been counseled on age-appropriate routine health concerns for screening and prevention. These are reviewed and up-to-date. Referrals have been placed accordingly. Immunizations are up-to-date or declined.    Subjective:   Chief Complaint  Patient presents with  . New Patient (Initial Visit)    sinusitis    HPI Brandy Reed 37 y.o. female presents to office today to establish care. She has a history of HTN and was started on Coreg a  few years ago however since then she was switched from coreg to amlodipine . She has been seeing a PCP in [place] and would like to establish care here today. She has concerns of possible sinusitis but endorses a history of allergic rhinitis as well.    Essential Hypertension Chronic. Taking amlodipine  however she has been out of her medication. Will refill amlodipine today and follow up on BP at next office visit.  Denies chest pain, shortness of breath, palpitations, lightheadedness, dizziness, headaches or BLE edema.  BP Readings from Last 3 Encounters:  12/14/17 (!) 159/76  07/09/17 (!) 148/88  04/28/16 (!) 160/110   Allergic Rhinitis  Brandy Reed is here for evaluation of allergic rhinitis. Patient's symptoms include clear rhinorrhea, itchy eyes, itchy nose, nasal congestion, postnasal drip, pressure sensation in ears, sinus pressure, swelling of eyes and watery eyes. These symptoms are seasonal. Current triggers include exposure to pollens. The patient has been suffering from these symptoms for approximately several  weeks. The patient has tried over the counter medications and benadryl with poor relief of symptoms. Immunotherapy has never been tried. The patient has a history of nasal polyps. The patient has no history of asthma. The patient does not suffer from frequent sinopulmonary infections. The patient has not had sinus surgery in the past. The patient has no history of eczema.  Review of Systems  Constitutional: Negative for fever, malaise/fatigue and weight loss.  HENT: Positive for congestion, ear pain and sinus pain. Negative for nosebleeds.        SEE HPI  Eyes: Positive for  redness. Negative for blurred vision, double vision and photophobia.  Respiratory: Negative for cough and shortness of breath.   Cardiovascular: Negative.  Negative for chest pain, palpitations and leg swelling.  Gastrointestinal: Negative.  Negative for heartburn, nausea and vomiting.   Skin: Negative for itching and rash.  Neurological: Negative.  Negative for dizziness, focal weakness, seizures and headaches.  Psychiatric/Behavioral: Negative.  Negative for suicidal ideas.    Past Medical History:  Diagnosis Date  . Allergy   . Depression    postpartum  . Headache(784.0)   . Hypertension   . PONV (postoperative nausea and vomiting)   . Sinus disorder    sometimes gets HA's    Past Surgical History:  Procedure Laterality Date  . CESAREAN SECTION  X 3  . CESAREAN SECTION N/A 07/07/2014   Procedure: CESAREAN SECTION;  Surgeon: Kathreen Cosier, MD;  Location: [facility];  Service: Obstetrics;  Laterality: N/A;  . CESAREAN SECTION N/A 03/29/2016   Procedure: CESAREAN SECTION;  Surgeon: Levie Heritage, DO;  Location: [facility] BIRTHING SUITES;  Service: Obstetrics;  Laterality: N/A;  . DILATION AND CURETTAGE OF UTERUS    . NOSE SURGERY    . WISDOM TOOTH EXTRACTION      Family History  Problem Relation Age of Onset  . Hypertension Neg Hx   . Hyperlipidemia Neg Hx     Social History Reviewed with no changes to be made today.   Outpatient Medications Prior to Visit  Medication Sig Dispense Refill  . coconut oil OIL Apply 1 application topically as needed. 500 mL PRN  . carvedilol (COREG) 25 MG tablet Take 1 tablet (25 mg total) by mouth 2 (two) times daily with a meal. 60 tablet 2  . ibuprofen (ADVIL,MOTRIN) 600 MG tablet Take 1 tablet (600 mg total) by mouth every 6 (six) hours. 120 tablet 2  . iron polysaccharides (NIFEREX) 150 MG capsule Take 1 capsule (150 mg total) by mouth 2 (two) times daily. 60 capsule 2  . oxyCODONE (ROXICODONE) 5 MG immediate release tablet Take 1 tablet (5 mg total) by mouth every 4 (four) hours as needed for severe pain. 30 tablet 0  . oxyCODONE-acetaminophen (ROXICET) 5-325 MG tablet Take 1-2 tablets by mouth every 4 (four) hours as needed for severe pain. (Patient not taking: Reported on 04/28/2016) 50 tablet 0  . phenazopyridine  (PYRIDIUM) 200 MG tablet Take 1 tablet (200 mg total) by mouth 3 (three) times daily as needed for pain. 30 tablet 0  . Prenat w/o A-FeCbn-Meth-FA-DHA (PRENATE MINI) 29-0.6-0.4-350 MG CAPS Take 1 capsule by mouth daily before breakfast. 90 capsule 3  . senna-docusate (SENOKOT-S) 8.6-50 MG tablet Take 2 tablets by mouth 2 (two) times daily. 90 tablet 2   Facility-Administered Medications Prior to Visit  Medication Dose Route Frequency Provider Last Rate Last Dose  . terbinafine (LAMISIL) 1 % cream   Topical BID Adam Phenix, MD        No Known Allergies     Objective:    BP (!) 159/76 (BP Location: Left Arm, Patient Position: Sitting, Cuff Size: Normal)   Pulse 82   Temp 98 F (36.7 C) (Oral)   Ht  (1.575 m)   Wt 187 lb 9.6 oz (85.1 kg)   LMP 11/14/2017   SpO2 99%   Breastfeeding? No   BMI 34.31 kg/m  Wt Readings from Last 3 Encounters:  12/14/17 187 lb 9.6 oz (85.1 kg)  07/09/17 169 lb (76.7 kg)  04/28/16 184 lb (  83.5 kg)    Physical Exam  Constitutional: She is oriented to person, place, and time. She appears well-developed and well-nourished. She appears ill.  HENT:  Head: Normocephalic.  Right Ear: Hearing, external ear and ear canal normal. A middle ear effusion is present.  Left Ear: Hearing, external ear and ear canal normal. A middle ear effusion is present.  Nose: Mucosal edema and rhinorrhea present. Right sinus exhibits no maxillary sinus tenderness and no frontal sinus tenderness. Left sinus exhibits no maxillary sinus tenderness and no frontal sinus tenderness.  Mouth/Throat: Uvula is midline and mucous membranes are normal. Posterior oropharyngeal erythema present. No oropharyngeal exudate, posterior oropharyngeal edema or tonsillar abscesses.  Eyes: Pupils are equal, round, and reactive to light. EOM and lids are normal. Right conjunctiva is injected. Left conjunctiva is injected.  Neck: Normal range of motion. No thyromegaly present.  Cardiovascular:  Normal rate and regular rhythm. Exam reveals no gallop and no friction rub.  No murmur heard. Pulmonary/Chest: Effort normal and breath sounds normal. No respiratory distress. She has no wheezes. She has no rales. She exhibits no tenderness.  Abdominal: Soft. Bowel sounds are normal.  Musculoskeletal: Normal range of motion.  Lymphadenopathy:    She has no cervical adenopathy.  Neurological: She is alert and oriented to person, place, and time.  Skin: Skin is warm and dry.  Psychiatric: She has a normal mood and affect. Her behavior is normal. Judgment and thought content normal.         Patient has been counseled extensively about nutrition and exercise as well as the importance of adherence with medications and regular follow-up. The patient was given clear instructions to go to ER or return to medical center if symptoms don't improve, worsen or new problems develop. The patient verbalized understanding.   Follow-up: Return in about 3 weeks (around 01/04/2018) for BP recheck.   Claiborne Rigg, FNP-BC [facility] [place], [place] [phone]   12/14/2017, 12:33 PM

## 2017-12-14 NOTE — Patient Instructions (Signed)
Allergic Rhinitis, Adult Allergic rhinitis is an allergic reaction that affects the mucous membrane inside the nose. It causes sneezing, a runny or stuffy nose, and the feeling of mucus going down the back of the throat (postnasal drip). Allergic rhinitis can be mild to severe. There are two types of allergic rhinitis:  Seasonal. This type is also called hay fever. It happens only during certain seasons.  Perennial. This type can happen at any time of the year.  What are the causes? This condition happens when the body's defense system (immune system) responds to certain harmless substances called allergens as though they were germs.  Seasonal allergic rhinitis is triggered by pollen, which can come from grasses, trees, and weeds. Perennial allergic rhinitis may be caused by:  House dust mites.  Pet dander.  Mold spores.  What are the signs or symptoms? Symptoms of this condition include:  Sneezing.  Runny or stuffy nose (nasal congestion).  Postnasal drip.  Itchy nose.  Tearing of the eyes.  Trouble sleeping.  Daytime sleepiness.  How is this diagnosed? This condition may be diagnosed based on:  Your medical history.  A physical exam.  Tests to check for related conditions, such as: ? Asthma. ? Pink eye. ? Ear infection. ? Upper respiratory infection.  Tests to find out which allergens trigger your symptoms. These may include skin or blood tests.  How is this treated? There is no cure for this condition, but treatment can help control symptoms. Treatment may include:  Taking medicines that block allergy symptoms, such as antihistamines. Medicine may be given as a shot, nasal spray, or pill.  Avoiding the allergen.  Desensitization. This treatment involves getting ongoing shots until your body becomes less sensitive to the allergen. This treatment may be done if other treatments do not help.  If taking medicine and avoiding the allergen does not work, new,  stronger medicines may be prescribed.  Follow these instructions at home:  Find out what you are allergic to. Common allergens include smoke, dust, and pollen.  Avoid the things you are allergic to. These are some things you can do to help avoid allergens: ? Replace carpet with wood, tile, or vinyl flooring. Carpet can trap dander and dust. ? Do not smoke. Do not allow smoking in your home. ? Change your heating and air conditioning filter at least once a month. ? During allergy season:  Keep windows closed as much as possible.  Plan outdoor activities when pollen counts are lowest. This is usually during the evening hours.  When coming indoors, change clothing and shower before sitting on furniture or bedding.  Take over-the-counter and prescription medicines only as told by your health care provider.  Keep all follow-up visits as told by your health care provider. This is important. Contact a health care provider if:  You have a fever.  You develop a persistent cough.  You make whistling sounds when you breathe (you wheeze).  Your symptoms interfere with your normal daily activities. Get help right away if:  You have shortness of breath. Summary  This condition can be managed by taking medicines as directed and avoiding allergens.  Contact your health care provider if you develop a persistent cough or fever.  During allergy season, keep windows closed as much as possible. This information is not intended to replace advice given to you by your health care provider. Make sure you discuss any questions you have with your health care provider. Document Released: 04/20/2001 Document Revised: 09/02/2016  Document Reviewed: 09/02/2016 Elsevier Interactive Patient Education  2018 [employer]  Sinus Rinse What is a sinus rinse? A sinus rinse is a home treatment. It rinses your sinuses with a mixture of salt and water (saline solution). Sinuses are air-filled spaces in your  skull behind the bones of your face and forehead. They open into your nasal cavity. To do a sinus rinse, you will need:  Saline solution.  Neti pot or spray bottle. This releases the saline solution into your nose and through your sinuses. You can buy neti pots and spray bottles at: ? [employer]. ? A health food store. ? Online.  When should I do a sinus rinse? A sinus rinse can help to clear your nasal cavity. It can clear:  Mucus.  Dirt.  Dust.  Pollen.  You may do a sinus rinse when you have:  A cold.  A virus.  Allergies.  A sinus infection.  A stuffy nose.  If you are considering a sinus rinse:  Ask your child's doctor before doing a sinus rinse on your child.  Do not do a sinus rinse if you have had: ? Ear or nasal surgery. ? An ear infection. ? Blocked ears.  How do I do a sinus rinse?  Wash your hands.  Disinfect your device using the directions that came with the device.  Dry your device.  Use the solution that comes with your device or one that is sold separately in stores. Follow the mixing directions on the package.  Fill your device with the amount of saline solution as stated in the device instructions.  Stand over a sink and tilt your head sideways over the sink.  Place the spout of the device in your upper nostril (the one closer to the ceiling).  Gently pour or squeeze the saline solution into the nasal cavity. The liquid should drain to the lower nostril if you are not too congested.  Gently blow your nose. Blowing too hard may cause ear pain.  Repeat in the other nostril.  Clean and rinse your device with clean water.  Air-dry your device. Are there risks of a sinus rinse? Sinus rinse is normally very safe and helpful. However, there are a few risks, which include:  A burning feeling in the sinuses. This may happen if you do not make the saline solution as instructed. Make sure to follow all directions when making the  saline solution.  Infection from unclean water. This is rare, but possible.  Nasal irritation.  This information is not intended to replace advice given to you by your health care provider. Make sure you discuss any questions you have with your health care provider. Document Released: 02/20/2014 Document Revised: 06/22/2016 Document Reviewed: 12/11/2013 Elsevier Interactive Patient Education  2017 [employer].

## 2017-12-28 ENCOUNTER — Ambulatory Visit (INDEPENDENT_AMBULATORY_CARE_PROVIDER_SITE_OTHER): Payer: Medicaid Other | Admitting: Nurse Practitioner

## 2018-07-09 DIAGNOSIS — K029 Dental caries, unspecified: Secondary | ICD-10-CM

## 2018-07-09 HISTORY — DX: Dental caries, unspecified: K02.9

## 2018-07-27 NOTE — H&P (Signed)
HISTORY AND PHYSICAL  Brandy Reed is aDelene Reed 37 y.o. female patient with CC: dental pain throughout her mouth. Referred by general dentist for full mouth extractions in preparation for upper and lower dentures.  No diagnosis found.  Past Medical History:  Diagnosis Date  . Allergy   . Depression    postpartum  . Headache(784.0)   . Hypertension   . PONV (postoperative nausea and vomiting)   . Sinus disorder    sometimes gets HA's    Current Facility-Administered Medications  Medication Dose Route Frequency Provider Last Rate Last Dose  . terbinafine (LAMISIL) 1 % cream   Topical BID Adam PhenixArnold, James G, MD       Current Outpatient Medications  Medication Sig Dispense Refill  . amLODipine (NORVASC) 5 MG tablet Take 1 tablet (5 mg total) by mouth daily. 90 tablet 3  . azelastine (ASTELIN) 0.1 % nasal spray Place 2 sprays into both nostrils 2 (two) times daily. Use in each nostril as directed 30 mL 12  . coconut oil OIL Apply 1 application topically as needed. 500 mL PRN  . loratadine (CLARITIN) 10 MG tablet Take 1 tablet (10 mg total) by mouth daily. 30 tablet 11   No Known Allergies Active Problems:   * No active hospital problems. *  Vitals: There were no vitals taken for this visit. Lab results:No results found for this or any previous visit (from the past 24 hour(s)). Radiology Results: No results found. General appearance: alert, cooperative and mildly obese Head: Normocephalic, without obvious abnormality, atraumatic Eyes: negative Nose: Nares normal. Septum midline. Mucosa normal. No drainage or sinus tenderness. Throat: multiple dental caries and large restorations. No purulence or edema. Pharynx clear.  Neck: no adenopathy, supple, symmetrical, trachea midline and thyroid not enlarged, symmetric, no tenderness/mass/nodules Resp: clear to auscultation bilaterally Cardio: regular rate and rhythm, S1, S2 normal, no murmur, click, rub or gallop  Assessment: Non-restorable  teeth secondary to caries.   Plan: Multiple dental extractions with GA. Day surgery    Ocie DoyneScott Karson Chicas 07/27/2018

## 2018-07-28 ENCOUNTER — Encounter (HOSPITAL_BASED_OUTPATIENT_CLINIC_OR_DEPARTMENT_OTHER): Payer: Self-pay | Admitting: *Deleted

## 2018-07-28 ENCOUNTER — Other Ambulatory Visit: Payer: Self-pay

## 2018-08-08 ENCOUNTER — Ambulatory Visit (HOSPITAL_BASED_OUTPATIENT_CLINIC_OR_DEPARTMENT_OTHER): Payer: Medicaid Other | Admitting: Certified Registered Nurse Anesthetist

## 2018-08-08 ENCOUNTER — Encounter (HOSPITAL_BASED_OUTPATIENT_CLINIC_OR_DEPARTMENT_OTHER): Payer: Self-pay | Admitting: *Deleted

## 2018-08-08 ENCOUNTER — Ambulatory Visit (HOSPITAL_BASED_OUTPATIENT_CLINIC_OR_DEPARTMENT_OTHER)
Admission: RE | Admit: 2018-08-08 | Discharge: 2018-08-08 | Disposition: A | Discharge status: Home / Self Care | Payer: Medicaid Other | Attending: Oral Surgery | Admitting: Oral Surgery

## 2018-08-08 ENCOUNTER — Encounter (HOSPITAL_BASED_OUTPATIENT_CLINIC_OR_DEPARTMENT_OTHER)
Admission: RE | Disposition: A | Discharge status: Home / Self Care | Payer: Self-pay | Source: Home / Self Care | Attending: Oral Surgery

## 2018-08-08 DIAGNOSIS — Z79899 Other long term (current) drug therapy: Secondary | ICD-10-CM | POA: Diagnosis not present

## 2018-08-08 DIAGNOSIS — K029 Dental caries, unspecified: Secondary | ICD-10-CM | POA: Insufficient documentation

## 2018-08-08 DIAGNOSIS — D649 Anemia, unspecified: Secondary | ICD-10-CM | POA: Insufficient documentation

## 2018-08-08 DIAGNOSIS — I10 Essential (primary) hypertension: Secondary | ICD-10-CM | POA: Diagnosis not present

## 2018-08-08 DIAGNOSIS — F17200 Nicotine dependence, unspecified, uncomplicated: Secondary | ICD-10-CM | POA: Insufficient documentation

## 2018-08-08 HISTORY — PX: TOOTH EXTRACTION: SHX859

## 2018-08-08 HISTORY — DX: Dental caries, unspecified: K02.9

## 2018-08-08 LAB — POCT PREGNANCY, URINE: PREG TEST UR: NEGATIVE

## 2018-08-08 SURGERY — DENTAL RESTORATION/EXTRACTIONS
Anesthesia: General | Site: Mouth | Laterality: Bilateral

## 2018-08-08 MED ORDER — ONDANSETRON HCL 4 MG/2ML IJ SOLN
INTRAMUSCULAR | Status: AC
  Filled 2018-08-08: qty 2

## 2018-08-08 MED ORDER — OXYCODONE HCL 5 MG PO TABS
ORAL_TABLET | ORAL | Status: AC
  Filled 2018-08-08: qty 1

## 2018-08-08 MED ORDER — EPHEDRINE SULFATE-NACL 50-0.9 MG/10ML-% IV SOSY
PREFILLED_SYRINGE | INTRAVENOUS | Status: DC | PRN
  Administered 2018-08-08 (×2): 10 mg via INTRAVENOUS

## 2018-08-08 MED ORDER — OXYCODONE HCL 5 MG PO TABS
5.0000 mg | ORAL_TABLET | Freq: Once | ORAL | Status: AC | PRN
  Administered 2018-08-08: 5 mg via ORAL

## 2018-08-08 MED ORDER — FENTANYL CITRATE (PF) 100 MCG/2ML IJ SOLN: 50.0000 ug | INTRAMUSCULAR | Status: DC | PRN

## 2018-08-08 MED ORDER — EPHEDRINE 5 MG/ML INJ
INTRAVENOUS | Status: AC
  Filled 2018-08-08: qty 10

## 2018-08-08 MED ORDER — MIDAZOLAM HCL 2 MG/2ML IJ SOLN: 1.0000 mg | INTRAMUSCULAR | Status: DC | PRN

## 2018-08-08 MED ORDER — ROCURONIUM BROMIDE 50 MG/5ML IV SOSY
PREFILLED_SYRINGE | INTRAVENOUS | Status: DC | PRN
  Administered 2018-08-08: 50 mg via INTRAVENOUS

## 2018-08-08 MED ORDER — SCOPOLAMINE 1 MG/3DAYS TD PT72: 1.0000 | MEDICATED_PATCH | Freq: Once | TRANSDERMAL | Status: DC | PRN

## 2018-08-08 MED ORDER — PHENYLEPHRINE 40 MCG/ML (10ML) SYRINGE FOR IV PUSH (FOR BLOOD PRESSURE SUPPORT)
PREFILLED_SYRINGE | INTRAVENOUS | Status: AC
  Filled 2018-08-08: qty 10

## 2018-08-08 MED ORDER — CEFAZOLIN SODIUM-DEXTROSE 2-4 GM/100ML-% IV SOLN
INTRAVENOUS | Status: AC
  Filled 2018-08-08: qty 100

## 2018-08-08 MED ORDER — DEXAMETHASONE SODIUM PHOSPHATE 10 MG/ML IJ SOLN
INTRAMUSCULAR | Status: DC | PRN
  Administered 2018-08-08: 10 mg via INTRAVENOUS

## 2018-08-08 MED ORDER — SUGAMMADEX SODIUM 200 MG/2ML IV SOLN
INTRAVENOUS | Status: DC | PRN
  Administered 2018-08-08: 200 mg via INTRAVENOUS

## 2018-08-08 MED ORDER — ROCURONIUM BROMIDE 50 MG/5ML IV SOSY
PREFILLED_SYRINGE | INTRAVENOUS | Status: AC
  Filled 2018-08-08: qty 5

## 2018-08-08 MED ORDER — DEXAMETHASONE SODIUM PHOSPHATE 10 MG/ML IJ SOLN
INTRAMUSCULAR | Status: AC
  Filled 2018-08-08: qty 1

## 2018-08-08 MED ORDER — MIDAZOLAM HCL 2 MG/2ML IJ SOLN
INTRAMUSCULAR | Status: DC | PRN
  Administered 2018-08-08: 2 mg via INTRAVENOUS

## 2018-08-08 MED ORDER — PHENYLEPHRINE 40 MCG/ML (10ML) SYRINGE FOR IV PUSH (FOR BLOOD PRESSURE SUPPORT)
PREFILLED_SYRINGE | INTRAVENOUS | Status: DC | PRN
  Administered 2018-08-08: 80 ug via INTRAVENOUS
  Administered 2018-08-08: 40 ug via INTRAVENOUS
  Administered 2018-08-08: 120 ug via INTRAVENOUS
  Administered 2018-08-08 (×2): 80 ug via INTRAVENOUS

## 2018-08-08 MED ORDER — FENTANYL CITRATE (PF) 100 MCG/2ML IJ SOLN
INTRAMUSCULAR | Status: AC
  Filled 2018-08-08: qty 2

## 2018-08-08 MED ORDER — ONDANSETRON HCL 4 MG/2ML IJ SOLN
INTRAMUSCULAR | Status: DC | PRN
  Administered 2018-08-08: 4 mg via INTRAVENOUS

## 2018-08-08 MED ORDER — MIDAZOLAM HCL 2 MG/2ML IJ SOLN
INTRAMUSCULAR | Status: AC
  Filled 2018-08-08: qty 2

## 2018-08-08 MED ORDER — FENTANYL CITRATE (PF) 100 MCG/2ML IJ SOLN
INTRAMUSCULAR | Status: DC | PRN
  Administered 2018-08-08: 100 ug via INTRAVENOUS

## 2018-08-08 MED ORDER — CLINDAMYCIN HCL 300 MG PO CAPS: 300.0000 mg | ORAL_CAPSULE | Freq: Three times a day (TID) | ORAL | 0 refills | Status: AC

## 2018-08-08 MED ORDER — PROMETHAZINE HCL 25 MG/ML IJ SOLN: 6.2500 mg | INTRAMUSCULAR | Status: DC | PRN

## 2018-08-08 MED ORDER — SUGAMMADEX SODIUM 200 MG/2ML IV SOLN
INTRAVENOUS | Status: AC
  Filled 2018-08-08: qty 2

## 2018-08-08 MED ORDER — LIDOCAINE-EPINEPHRINE 2 %-1:100000 IJ SOLN
INTRAMUSCULAR | Status: DC | PRN
  Administered 2018-08-08: 24 mL via INTRADERMAL

## 2018-08-08 MED ORDER — PROPOFOL 10 MG/ML IV BOLUS
INTRAVENOUS | Status: DC | PRN
  Administered 2018-08-08: 130 mg via INTRAVENOUS

## 2018-08-08 MED ORDER — CEFAZOLIN SODIUM-DEXTROSE 2-4 GM/100ML-% IV SOLN
2.0000 g | INTRAVENOUS | Status: AC
  Administered 2018-08-08: 2 g via INTRAVENOUS

## 2018-08-08 MED ORDER — OXYCODONE HCL 5 MG/5ML PO SOLN: 5.0000 mg | Freq: Once | ORAL | Status: AC | PRN

## 2018-08-08 MED ORDER — MEPERIDINE HCL 25 MG/ML IJ SOLN: 6.2500 mg | INTRAMUSCULAR | Status: DC | PRN

## 2018-08-08 MED ORDER — LIDOCAINE 2% (20 MG/ML) 5 ML SYRINGE
INTRAMUSCULAR | Status: AC
  Filled 2018-08-08: qty 5

## 2018-08-08 MED ORDER — OXYCODONE HCL 5 MG PO TABS: 5.0000 mg | ORAL_TABLET | ORAL | 0 refills | Status: AC | PRN

## 2018-08-08 MED ORDER — LACTATED RINGERS IV SOLN
INTRAVENOUS | Status: DC
  Administered 2018-08-08: 11:00:00 via INTRAVENOUS

## 2018-08-08 MED ORDER — LIDOCAINE 2% (20 MG/ML) 5 ML SYRINGE
INTRAMUSCULAR | Status: DC | PRN
  Administered 2018-08-08: 100 mg via INTRAVENOUS

## 2018-08-08 MED ORDER — FENTANYL CITRATE (PF) 100 MCG/2ML IJ SOLN
25.0000 ug | INTRAMUSCULAR | Status: DC | PRN
  Administered 2018-08-08: 50 ug via INTRAVENOUS

## 2018-08-08 SURGICAL SUPPLY — 40 items
BANDAGE COBAN STERILE 2 (GAUZE/BANDAGES/DRESSINGS) IMPLANT
BLADE SURG 15 STRL LF DISP TIS (BLADE) ×1 IMPLANT
BLADE SURG 15 STRL SS (BLADE) ×2
BNDG EYE OVAL (GAUZE/BANDAGES/DRESSINGS) IMPLANT
BUR CROSS CUT FISSURE 1.6 (BURR) ×2 IMPLANT
BUR EGG ELITE 4.0 (BURR) IMPLANT
CANISTER SUCT 1200ML W/VALVE (MISCELLANEOUS) ×2 IMPLANT
CATH ROBINSON RED A/P 10FR (CATHETERS) IMPLANT
COVER BACK TABLE 60X90IN (DRAPES) ×2 IMPLANT
COVER MAYO STAND STRL (DRAPES) ×2 IMPLANT
COVER WAND RF STERILE (DRAPES) IMPLANT
DECANTER SPIKE VIAL GLASS SM (MISCELLANEOUS) IMPLANT
DRAPE U-SHAPE 76X120 STRL (DRAPES) ×2 IMPLANT
GAUZE PACKING FOLDED 2  STR (GAUZE/BANDAGES/DRESSINGS) ×1
GAUZE PACKING FOLDED 2 STR (GAUZE/BANDAGES/DRESSINGS) ×1 IMPLANT
GAUZE PACKING IODOFORM 1/4X15 (GAUZE/BANDAGES/DRESSINGS) IMPLANT
GLOVE BIO SURGEON STRL SZ 6.5 (GLOVE) ×2 IMPLANT
GLOVE BIO SURGEON STRL SZ7.5 (GLOVE) ×2 IMPLANT
GLOVE BIOGEL PI IND STRL 6.5 (GLOVE) ×1 IMPLANT
GLOVE BIOGEL PI INDICATOR 6.5 (GLOVE) ×1
GOWN STRL REUS W/ TWL LRG LVL3 (GOWN DISPOSABLE) ×1 IMPLANT
GOWN STRL REUS W/ TWL XL LVL3 (GOWN DISPOSABLE) ×1 IMPLANT
GOWN STRL REUS W/TWL LRG LVL3 (GOWN DISPOSABLE) ×2
GOWN STRL REUS W/TWL XL LVL3 (GOWN DISPOSABLE) ×2
IV NS 500ML (IV SOLUTION) ×2
IV NS 500ML BAXH (IV SOLUTION) ×1 IMPLANT
NEEDLE HYPO 22GX1.5 SAFETY (NEEDLE) ×2 IMPLANT
NS IRRIG 1000ML POUR BTL (IV SOLUTION) ×2 IMPLANT
PACK BASIN DAY SURGERY FS (CUSTOM PROCEDURE TRAY) ×2 IMPLANT
SLEEVE SCD COMPRESS KNEE MED (MISCELLANEOUS) IMPLANT
SPONGE SURGIFOAM ABS GEL 12-7 (HEMOSTASIS) IMPLANT
SUT CHROMIC 3 0 PS 2 (SUTURE) ×2 IMPLANT
SYR BULB 3OZ (MISCELLANEOUS) ×2 IMPLANT
SYR CONTROL 10ML LL (SYRINGE) ×2 IMPLANT
TOOTHBRUSH ADULT (PERSONAL CARE ITEMS) IMPLANT
TOWEL GREEN STERILE FF (TOWEL DISPOSABLE) ×2 IMPLANT
TRAY DSU PREP LF (CUSTOM PROCEDURE TRAY) IMPLANT
TUBE CONNECTING 20X1/4 (TUBING) ×2 IMPLANT
TUBING IRRIGATION (MISCELLANEOUS) ×2 IMPLANT
YANKAUER SUCT BULB TIP NO VENT (SUCTIONS) ×2 IMPLANT

## 2018-08-08 NOTE — Op Note (Signed)
NAME: Brandy Reed, Deziya D. MEDICAL RECORD NW:[id number] ACCOUNT [id number] DATE OF BIRTH:1980-12-31 FACILITY: [facility] LOCATION: [facility] PHYSICIAN:Terel Bann M. Marye Eagen, DDS  OPERATIVE REPORT  DATE OF PROCEDURE:  08/08/2018  PREOPERATIVE DIAGNOSIS:  Nonrestorable teeth secondary to dental caries 2 3, 4, 5, 6 7, 8, 9, 10, 11, 12, 13, 14, 19, 21, 22, 23, 24, 25, 26, 27, 28, 29, 30, 31.  POSTOPERATIVE DIAGNOSIS:  Nonrestorable teeth secondary to dental caries 2 3, 4, 5, 6 7, 8, 9, 10, 11, 12, 13, 14, 19, 21, 22, 23, 24, 25, 26, 27, 28, 29, 30, 31.  PROCEDURE:  Extraction of teeth numbers as per above, alveoplasty right and left maxilla and mandible.  SURGEON:  Ocie DoyneScott Lamoine Fredricksen, DDS  ANESTHESIA:  General nasal intubation, Dr. Renold DonGermeroth attending.  DESCRIPTION OF PROCEDURE:  The patient was taken to the operating room and placed on the table in supine position.  General anesthesia was administered and a nasal endotracheal tube was placed and secured.  The eyes were protected and the patient was  draped for surgery.  A timeout was performed.  The posterior pharynx was suctioned and a throat pack was placed, 2% lidocaine 1:100,000 epinephrine was infiltrated in an inferior alveolar block on the right and left sides and buccal infiltration and  palatal infiltration in the right and left maxilla.  Additional anesthetic solution was given in the anterior buccal region of the mandible.  A total of 18 mL was given, a bite block was placed on the right side of the mouth and a sweetheart retractor  was used to retract the tongue.  A #15 blade was used to make an incision beginning at tooth 19 and carried forward in the buccal and lingual sulcus across the midline to tooth 27.  A similar incision was created in the maxilla beginning at tooth 14  buccally and palatally in the gingival sulcus and carried across the midline to tooth 6.  The periosteum was reflected in both the arches and then the teeth were elevated with  a 301 elevator.  Dental forceps was used to extract the teeth.  Tooth 19  fractured upon attempted removal, necessitating sectioning of the tooth with a Stryker handpiece and removal of circumferential bone around the roots.  The tooth was then extracted with a 301 elevator and the maxilla.  Tooth 14 required sectioning and  removal of bone around the roots and then the tooth was removed with the 301 elevator and rongeurs.  Tooth 11 fractured upon attempted removal and required removal of circumferential bone with a Stryker handpiece.  When the left side had been removed,  the periosteum was reflected and then alveoplasty was performed in the left maxilla and mandible using an egg-shaped bur and a Stryker handpiece with irrigation and a bone file.  Then, the areas were irrigated and closed with 3-0 chromic.  Then, the bite  block and sweetheart retractor were repositioned to the other side of the mouth.  Attention was turned to the right mandible.  Incision was created from tooth 31 to tooth 27 buccally and lingually in the gingival sulcus.  In the maxilla, the incision  was created from tooth 2 to tooth 6 buccally and palatally in the gingival sulcus.  Then, the periosteum was reflected from around these teeth.  The teeth were elevated with a 301 elevator.  Tooth 31, 28 and 27 were removed with a lower dental forceps.   Teeth 29 and 30 fractured upon attempted removal and therefore bone was removed and  the roots were sectioned on tooth 30 with a Stryker handpiece and the roots were removed with the 301 elevator and rongeurs and the maxilla.  Teeth 2, 3, 4 and 6 were  removed with the upper Universal forceps.  Tooth 5 fractured upon removal and additional bone was removed using a Stryker handpiece.  Then, the root was removed with the rongeurs.  Then, the right periosteum was reflected both buccally and lingually in  the mandible and buccally and palatally in the maxilla.  An alveoplasty was performed using  an egg-shaped bur under irrigation and a bone file.  Then, the areas were irrigated and closed with 3-0 chromic.  Then, the oral cavity was irrigated and  suctioned.  The throat pack was removed.  The patient was left in care of Anesthesia for transport to recovery room with plans for discharge home through day surgery.  ESTIMATED BLOOD LOSS:  Minimal.  COMPLICATIONS:  None.  SPECIMENS:  None.  TN/NUANCE  D:08/08/2018 T:08/08/2018 JOB:[id number]

## 2018-08-08 NOTE — H&P (Signed)
H&P documentation  -History and Physical Reviewed  -Patient has been re-examined  -No change in the plan of care  Brandy Reed  

## 2018-08-08 NOTE — Transfer of Care (Signed)
Immediate Anesthesia Transfer of Care Note  Patient: Brandy Reed  Procedure(s) Performed: FULL MOUTH EXTRACTIONS (Bilateral Mouth)  Patient Location: PACU  Anesthesia Type:General  Level of Consciousness: awake, alert  and oriented  Airway & Oxygen Therapy: Patient Spontanous Breathing and Patient connected to face mask oxygen  Post-op Assessment: Report given to RN and Post -op Vital signs reviewed and stable  Post vital signs: Reviewed and stable  Last Vitals:  Vitals Value Taken Time  BP 124/66 08/08/2018 11:57 AM  Temp    Pulse 99 08/08/2018 11:58 AM  Resp 17 08/08/2018 11:58 AM  SpO2 100 % 08/08/2018 11:58 AM  Vitals shown include unvalidated device data.  Last Pain:  Vitals:   08/08/18 1042  TempSrc: Oral  PainSc: 5          Complications: No apparent anesthesia complications

## 2018-08-08 NOTE — Discharge Instructions (Signed)

## 2018-08-08 NOTE — Anesthesia Preprocedure Evaluation (Signed)
Anesthesia Evaluation  Patient identified by MRN, date of birth, ID band Patient awake    Reviewed: Allergy & Precautions, H&P , Patient's Chart, lab work & pertinent test results  History of Anesthesia Complications (+) PONV and history of anesthetic complications  Airway Mallampati: II  TM Distance: >3 FB Neck ROM: full    Dental no notable dental hx.    Pulmonary Current Smoker,    Pulmonary exam normal breath sounds clear to auscultation       Cardiovascular Exercise Tolerance: Good hypertension,  Rhythm:regular Rate:Normal     Neuro/Psych  Headaches,    GI/Hepatic   Endo/Other    Renal/GU      Musculoskeletal   Abdominal   Peds  Hematology  (+) anemia ,   Anesthesia Other Findings   Reproductive/Obstetrics                             Anesthesia Physical  Anesthesia Plan  ASA: II  Anesthesia Plan: General   Post-op Pain Management:    Induction: Intravenous  PONV Risk Score and Plan: 3 and Dexamethasone, Ondansetron, Midazolam and Treatment may vary due to age or medical condition  Airway Management Planned: Oral ETT and Nasal ETT  Additional Equipment:   Intra-op Plan:   Post-operative Plan: Extubation in OR  Informed Consent: I have reviewed the patients History and Physical, chart, labs and discussed the procedure including the risks, benefits and alternatives for the proposed anesthesia with the patient or authorized representative who has indicated his/her understanding and acceptance.   Dental Advisory Given  Plan Discussed with: CRNA  Anesthesia Plan Comments:         Anesthesia Quick Evaluation

## 2018-08-08 NOTE — Anesthesia Procedure Notes (Signed)
Procedure Name: Intubation Date/Time: 08/08/2018 11:02 AM Performed by: Pearson Grippeobertson, Dilara Navarrete M, CRNA Pre-anesthesia Checklist: Patient identified, Emergency Drugs available, Suction available and Patient being monitored Patient Re-evaluated:Patient Re-evaluated prior to induction Oxygen Delivery Method: Circle system utilized Preoxygenation: Pre-oxygenation with 100% oxygen Induction Type: IV induction Ventilation: Mask ventilation without difficulty and Oral airway inserted - appropriate to patient size Laryngoscope Size: Hyacinth MeekerMiller and 2 Grade View: Grade I Nasal Tubes: Nasal prep performed, Nasal Rae and Right Tube size: 7.0 mm Number of attempts: 1 Placement Confirmation: ETT inserted through vocal cords under direct vision,  positive ETCO2 and breath sounds checked- equal and bilateral Secured at: 29 cm Tube secured with: Tape Dental Injury: Teeth and Oropharynx as per pre-operative assessment

## 2018-08-08 NOTE — Op Note (Signed)
08/08/2018  11:49 AM  PATIENT:  Brandy RuffiniJoesanna D Reed  37 y.o. female  PRE-OPERATIVE DIAGNOSIS:  NON-RESTORABLE TEETH # 2, 3, 4, 5, 6, 7, 8, 9, 10, 11, 12, 13, 14, 19, 20, 21, 22, 23, 24, 25, 26, 27, 28, 29, 30, 31   POST-OPERATIVE DIAGNOSIS:  SAME  PROCEDURE:  Procedure(s): EXTRACTION TEETH # 2, 3, 4, 5, 6, 7, 8, 9, 10, 11, 12, 13, 14, 19, 20, 21, 22, 23, 24, 25, 26, 27, 28, 29, 30, 31, ALVEOLOPLASTY  SURGEON:  Surgeon(s): Ocie DoyneJensen, Helios Kohlmann, DDS  ANESTHESIA:   local and general  EBL:  minimal  DRAINS: none   SPECIMEN:  No Specimen  COUNTS:  YES  PLAN OF CARE: Discharge to home after PACU  PATIENT DISPOSITION:  PACU - hemodynamically stable.   PROCEDURE DETAILS: Dictation # [id number]  Georgia LopesScott M. Kahli Fitzgerald, DMD 08/08/2018 11:49 AM

## 2018-08-08 NOTE — Anesthesia Postprocedure Evaluation (Signed)
Anesthesia Post Note  Patient: Brandy Reed  Procedure(s) Performed: FULL MOUTH EXTRACTIONS (Bilateral Mouth)     Patient location during evaluation: PACU Anesthesia Type: General Level of consciousness: sedated and patient cooperative Pain management: pain level controlled Vital Signs Assessment: post-procedure vital signs reviewed and stable Respiratory status: spontaneous breathing Cardiovascular status: stable Anesthetic complications: no    Last Vitals:  Vitals:   08/08/18 1245 08/08/18 1315  BP: 121/72 127/78  Pulse: 91 86  Resp: 19 18  Temp:  36.9 C  SpO2: 98% 100%    Last Pain:  Vitals:   08/08/18 1245  TempSrc:   PainSc: 3                  Lewie LoronJohn Hershal Eriksson

## 2018-08-10 ENCOUNTER — Encounter (HOSPITAL_BASED_OUTPATIENT_CLINIC_OR_DEPARTMENT_OTHER): Payer: Self-pay | Admitting: Oral Surgery

## 2018-10-10 ENCOUNTER — Emergency Department (HOSPITAL_COMMUNITY)
Admission: EM | Admit: 2018-10-10 | Discharge: 2018-10-11 | Disposition: A | Discharge status: Home / Self Care | Payer: Medicaid Other | Attending: Emergency Medicine | Admitting: Emergency Medicine

## 2018-10-10 ENCOUNTER — Emergency Department (HOSPITAL_COMMUNITY): Payer: Medicaid Other

## 2018-10-10 ENCOUNTER — Other Ambulatory Visit: Payer: Self-pay

## 2018-10-10 ENCOUNTER — Encounter (HOSPITAL_COMMUNITY): Payer: Self-pay

## 2018-10-10 DIAGNOSIS — Z5321 Procedure and treatment not carried out due to patient leaving prior to being seen by health care provider: Secondary | ICD-10-CM | POA: Insufficient documentation

## 2018-10-10 DIAGNOSIS — R079 Chest pain, unspecified: Secondary | ICD-10-CM | POA: Diagnosis not present

## 2018-10-10 LAB — CBC
HCT: 35.5 % — ABNORMAL LOW (ref 36.0–46.0)
Hemoglobin: 10.8 g/dL — ABNORMAL LOW (ref 12.0–15.0)
MCH: 25.2 pg — ABNORMAL LOW (ref 26.0–34.0)
MCHC: 30.4 g/dL (ref 30.0–36.0)
MCV: 82.9 fL (ref 80.0–100.0)
NRBC: 0 % (ref 0.0–0.2)
Platelets: 475 10*3/uL — ABNORMAL HIGH (ref 150–400)
RBC: 4.28 MIL/uL (ref 3.87–5.11)
RDW: 13.2 % (ref 11.5–15.5)
WBC: 13.5 10*3/uL — ABNORMAL HIGH (ref 4.0–10.5)

## 2018-10-10 LAB — BASIC METABOLIC PANEL
Anion gap: 5 (ref 5–15)
BUN: 8 mg/dL (ref 6–20)
CO2: 26 mmol/L (ref 22–32)
Calcium: 8.6 mg/dL — ABNORMAL LOW (ref 8.9–10.3)
Chloride: 104 mmol/L (ref 98–111)
Creatinine, Ser: 0.93 mg/dL (ref 0.44–1.00)
GFR calc Af Amer: >60 mL/min (ref >60)
Glucose, Bld: 93 mg/dL (ref 70–99)
Potassium: 3.5 mmol/L (ref 3.5–5.1)
SODIUM: 135 mmol/L (ref 135–145)

## 2018-10-10 LAB — I-STAT BETA HCG BLOOD, ED (MC, WL, AP ONLY): I-stat hCG, quantitative: <5 m[IU]/mL (ref <5)

## 2018-10-10 LAB — I-STAT TROPONIN, ED: TROPONIN I, POC: 0.01 ng/mL (ref 0.00–0.08)

## 2018-10-10 MED ORDER — SODIUM CHLORIDE 0.9% FLUSH: 3.0000 mL | Freq: Once | INTRAVENOUS | Status: DC

## 2018-10-10 NOTE — ED Triage Notes (Signed)
Pt states that she has been having CP with SOB all day along with radiation to L arm and shoulder. Pt states she has bee very anxious lately.

## 2018-10-11 NOTE — ED Notes (Signed)
Pt called the ED and stated that she has left and she would like her lab results. Pt states that she will return to the ED.

## 2018-10-11 NOTE — ED Notes (Signed)
Pt asking about wait times and states that she wants her results and want to leave. Advised pt that she should stay to see the doctor, pt agrees to stay.

## 2018-12-15 ENCOUNTER — Other Ambulatory Visit: Payer: Self-pay | Admitting: Nurse Practitioner

## 2018-12-15 DIAGNOSIS — N6489 Other specified disorders of breast: Secondary | ICD-10-CM

## 2019-10-01 ENCOUNTER — Ambulatory Visit: Payer: Medicaid Other | Admitting: Neurology

## 2019-10-01 ENCOUNTER — Telehealth: Payer: Self-pay

## 2019-10-01 NOTE — Telephone Encounter (Signed)
Patient no showed 10/01/2019 appointment with Dr. Athar.  

## 2019-10-08 ENCOUNTER — Encounter: Payer: Self-pay | Admitting: Neurology

## 2021-05-07 ENCOUNTER — Other Ambulatory Visit (HOSPITAL_COMMUNITY): Payer: Self-pay

## 2021-05-07 ENCOUNTER — Other Ambulatory Visit (HOSPITAL_BASED_OUTPATIENT_CLINIC_OR_DEPARTMENT_OTHER): Payer: Self-pay

## 2021-05-07 MED ORDER — OXYCODONE HCL 10 MG PO TABS
ORAL_TABLET | ORAL | 0 refills | Status: DC
  Filled 2021-05-07: qty 150, 30d supply, fill #0

## 2021-05-08 ENCOUNTER — Other Ambulatory Visit (HOSPITAL_COMMUNITY): Payer: Self-pay

## 2021-06-29 ENCOUNTER — Ambulatory Visit
Admission: RE | Admit: 2021-06-29 | Discharge: 2021-06-29 | Disposition: A | Discharge status: Home / Self Care | Payer: Medicaid Other | Source: Ambulatory Visit | Attending: Physician Assistant | Admitting: Physician Assistant

## 2021-06-29 ENCOUNTER — Other Ambulatory Visit: Payer: Self-pay | Admitting: Physician Assistant

## 2021-06-29 DIAGNOSIS — M25562 Pain in left knee: Secondary | ICD-10-CM

## 2021-06-29 DIAGNOSIS — M25512 Pain in left shoulder: Secondary | ICD-10-CM

## 2021-06-29 DIAGNOSIS — M25561 Pain in right knee: Secondary | ICD-10-CM

## 2022-07-05 ENCOUNTER — Other Ambulatory Visit (HOSPITAL_BASED_OUTPATIENT_CLINIC_OR_DEPARTMENT_OTHER): Payer: Self-pay

## 2022-07-05 MED ORDER — ALBUTEROL SULFATE HFA 108 (90 BASE) MCG/ACT IN AERS
2.0000 | INHALATION_SPRAY | RESPIRATORY_TRACT | 0 refills | Status: AC
  Filled 2022-07-05: qty 18, 28d supply, fill #0

## 2022-07-05 MED ORDER — OXYCODONE HCL 10 MG PO TABS
10.0000 mg | ORAL_TABLET | Freq: Every day | ORAL | 0 refills | Status: DC
  Filled 2022-07-05: qty 150, 30d supply, fill #0

## 2022-07-05 MED ORDER — METHYLPREDNISOLONE 4 MG PO TBPK
ORAL_TABLET | ORAL | 0 refills | Status: DC
  Filled 2022-07-05: qty 21, 6d supply, fill #0

## 2022-07-05 MED ORDER — AZITHROMYCIN 250 MG PO TABS
ORAL_TABLET | ORAL | 0 refills | Status: DC
  Filled 2022-07-05: qty 6, 5d supply, fill #0

## 2022-07-06 ENCOUNTER — Other Ambulatory Visit (HOSPITAL_BASED_OUTPATIENT_CLINIC_OR_DEPARTMENT_OTHER): Payer: Self-pay

## 2022-07-12 ENCOUNTER — Other Ambulatory Visit (HOSPITAL_BASED_OUTPATIENT_CLINIC_OR_DEPARTMENT_OTHER): Payer: Self-pay

## 2022-07-12 MED ORDER — ALBUTEROL SULFATE HFA 108 (90 BASE) MCG/ACT IN AERS
INHALATION_SPRAY | RESPIRATORY_TRACT | 0 refills | Status: AC
  Filled 2022-07-12 – 2022-07-23 (×2): qty 18, 17d supply, fill #0

## 2022-07-12 MED ORDER — AZITHROMYCIN 250 MG PO TABS
ORAL_TABLET | ORAL | 0 refills | Status: AC
  Filled 2022-07-12 – 2022-07-23 (×2): qty 6, 5d supply, fill #0

## 2022-07-12 MED ORDER — AMLODIPINE BESYLATE 10 MG PO TABS
10.0000 mg | ORAL_TABLET | Freq: Every day | ORAL | 1 refills | Status: AC
  Filled 2022-07-12 – 2022-07-23 (×3): qty 90, 90d supply, fill #0

## 2022-07-12 MED ORDER — METHYLPREDNISOLONE 4 MG PO TBPK
ORAL_TABLET | ORAL | 0 refills | Status: DC
  Filled 2022-07-12: qty 21, 6d supply, fill #0

## 2022-07-15 ENCOUNTER — Other Ambulatory Visit (HOSPITAL_BASED_OUTPATIENT_CLINIC_OR_DEPARTMENT_OTHER): Payer: Self-pay

## 2022-07-23 ENCOUNTER — Other Ambulatory Visit (HOSPITAL_BASED_OUTPATIENT_CLINIC_OR_DEPARTMENT_OTHER): Payer: Self-pay

## 2022-07-23 MED ORDER — NICOTINE 21 MG/24HR TD PT24
21.0000 mg | MEDICATED_PATCH | Freq: Every day | TRANSDERMAL | 1 refills | Status: DC
  Filled 2022-07-23: qty 28, 28d supply, fill #0

## 2022-07-23 MED ORDER — OXYCODONE HCL 10 MG PO TABS
10.0000 mg | ORAL_TABLET | Freq: Every day | ORAL | 0 refills | Status: AC | PRN
  Filled 2022-07-23: qty 35, 7d supply, fill #0

## 2022-07-24 ENCOUNTER — Other Ambulatory Visit (HOSPITAL_BASED_OUTPATIENT_CLINIC_OR_DEPARTMENT_OTHER): Payer: Self-pay

## 2022-08-16 ENCOUNTER — Other Ambulatory Visit (HOSPITAL_BASED_OUTPATIENT_CLINIC_OR_DEPARTMENT_OTHER): Payer: Self-pay

## 2022-08-16 MED ORDER — PROMETHAZINE-DM 6.25-15 MG/5ML PO SYRP
5.0000 mL | ORAL_SOLUTION | ORAL | 0 refills | Status: AC | PRN
  Filled 2022-08-16: qty 120, 4d supply, fill #0

## 2023-05-03 ENCOUNTER — Other Ambulatory Visit: Payer: Self-pay

## 2023-05-03 ENCOUNTER — Emergency Department (HOSPITAL_BASED_OUTPATIENT_CLINIC_OR_DEPARTMENT_OTHER): Payer: Medicaid Other | Admitting: Radiology

## 2023-05-03 ENCOUNTER — Emergency Department (HOSPITAL_BASED_OUTPATIENT_CLINIC_OR_DEPARTMENT_OTHER): Payer: Medicaid Other

## 2023-05-03 ENCOUNTER — Encounter (HOSPITAL_BASED_OUTPATIENT_CLINIC_OR_DEPARTMENT_OTHER): Payer: Self-pay

## 2023-05-03 ENCOUNTER — Emergency Department (HOSPITAL_BASED_OUTPATIENT_CLINIC_OR_DEPARTMENT_OTHER)
Admission: EM | Admit: 2023-05-03 | Discharge: 2023-05-03 | Disposition: A | Discharge status: Home / Self Care | Payer: Medicaid Other

## 2023-05-03 DIAGNOSIS — F17290 Nicotine dependence, other tobacco product, uncomplicated: Secondary | ICD-10-CM | POA: Diagnosis not present

## 2023-05-03 DIAGNOSIS — I10 Essential (primary) hypertension: Secondary | ICD-10-CM | POA: Diagnosis not present

## 2023-05-03 DIAGNOSIS — R1013 Epigastric pain: Secondary | ICD-10-CM

## 2023-05-03 DIAGNOSIS — R112 Nausea with vomiting, unspecified: Secondary | ICD-10-CM | POA: Diagnosis not present

## 2023-05-03 DIAGNOSIS — R0602 Shortness of breath: Secondary | ICD-10-CM | POA: Diagnosis not present

## 2023-05-03 DIAGNOSIS — D72829 Elevated white blood cell count, unspecified: Secondary | ICD-10-CM | POA: Insufficient documentation

## 2023-05-03 DIAGNOSIS — R1032 Left lower quadrant pain: Secondary | ICD-10-CM | POA: Diagnosis not present

## 2023-05-03 LAB — BASIC METABOLIC PANEL
Anion gap: 7 (ref 5–15)
BUN: 9 mg/dL (ref 6–20)
CO2: 29 mmol/L (ref 22–32)
Calcium: 9.5 mg/dL (ref 8.9–10.3)
Chloride: 101 mmol/L (ref 98–111)
Creatinine, Ser: 0.84 mg/dL (ref 0.44–1.00)
GFR, Estimated: >60 mL/min (ref >60)
Glucose, Bld: 116 mg/dL — ABNORMAL HIGH (ref 70–99)
Potassium: 3.7 mmol/L (ref 3.5–5.1)
Sodium: 137 mmol/L (ref 135–145)

## 2023-05-03 LAB — HEPATIC FUNCTION PANEL
ALT: 13 U/L (ref 0–44)
AST: 16 U/L (ref 15–41)
Albumin: 4.4 g/dL (ref 3.5–5.0)
Alkaline Phosphatase: 78 U/L (ref 38–126)
Bilirubin, Direct: <0.1 mg/dL (ref 0.0–0.2)
Total Bilirubin: 0.3 mg/dL (ref 0.3–1.2)
Total Protein: 8 g/dL (ref 6.5–8.1)

## 2023-05-03 LAB — CBC WITH DIFFERENTIAL/PLATELET
Abs Immature Granulocytes: 0.07 10*3/uL (ref 0.00–0.07)
Basophils Absolute: 0.1 10*3/uL (ref 0.0–0.1)
Basophils Relative: 1 %
Eosinophils Absolute: 0.2 10*3/uL (ref 0.0–0.5)
Eosinophils Relative: 2 %
HCT: 41 % (ref 36.0–46.0)
Hemoglobin: 13.4 g/dL (ref 12.0–15.0)
Immature Granulocytes: 1 %
Lymphocytes Relative: 14 %
Lymphs Abs: 1.8 10*3/uL (ref 0.7–4.0)
MCH: 25.9 pg — ABNORMAL LOW (ref 26.0–34.0)
MCHC: 32.7 g/dL (ref 30.0–36.0)
MCV: 79.3 fL — ABNORMAL LOW (ref 80.0–100.0)
Monocytes Absolute: 1 10*3/uL (ref 0.1–1.0)
Monocytes Relative: 8 %
Neutro Abs: 9.8 10*3/uL — ABNORMAL HIGH (ref 1.7–7.7)
Neutrophils Relative %: 74 %
Platelets: 509 10*3/uL — ABNORMAL HIGH (ref 150–400)
RBC: 5.17 MIL/uL — ABNORMAL HIGH (ref 3.87–5.11)
RDW: 14 % (ref 11.5–15.5)
WBC: 13 10*3/uL — ABNORMAL HIGH (ref 4.0–10.5)
nRBC: 0 % (ref 0.0–0.2)

## 2023-05-03 LAB — BRAIN NATRIURETIC PEPTIDE: B Natriuretic Peptide: 10.2 pg/mL (ref 0.0–100.0)

## 2023-05-03 LAB — LIPASE, BLOOD: Lipase: 22 U/L (ref 11–51)

## 2023-05-03 MED ORDER — ONDANSETRON HCL 4 MG/2ML IJ SOLN
4.0000 mg | Freq: Once | INTRAMUSCULAR | Status: DC
  Filled 2023-05-03: qty 2

## 2023-05-03 MED ORDER — SODIUM CHLORIDE 0.9 % IV BOLUS
1000.0000 mL | Freq: Once | INTRAVENOUS | Status: AC
  Administered 2023-05-03: 1000 mL via INTRAVENOUS

## 2023-05-03 MED ORDER — PANTOPRAZOLE SODIUM 40 MG IV SOLR
40.0000 mg | Freq: Once | INTRAVENOUS | Status: AC
  Administered 2023-05-03: 40 mg via INTRAVENOUS
  Filled 2023-05-03: qty 10

## 2023-05-03 MED ORDER — IOHEXOL 300 MG/ML  SOLN
100.0000 mL | Freq: Once | INTRAMUSCULAR | Status: AC | PRN
  Administered 2023-05-03: 100 mL via INTRAVENOUS

## 2023-05-03 MED ORDER — MORPHINE SULFATE (PF) 4 MG/ML IV SOLN
4.0000 mg | Freq: Once | INTRAVENOUS | Status: AC
  Administered 2023-05-03: 4 mg via INTRAVENOUS
  Filled 2023-05-03: qty 1

## 2023-05-03 NOTE — ED Notes (Signed)
Pt requested discharge due to having to pick her children up from school at 2:15pm. Pt advised she would have her doctor pull her results from MyChart. Pt was advised of the risks of leaving AMA up to and including death, to which she acknowledged and agreed. ED PA was made aware

## 2023-05-03 NOTE — Discharge Instructions (Signed)
Your laboratories also are within normal limits today.  The CT of your abdomen did not show any acute findings.  You are also given a referral for gastroenterology.  We discussed the x-ray of your chest, you will need to have follow-up for your 6 mm pulmonary nodule.

## 2023-05-03 NOTE — ED Provider Notes (Signed)
[facility] AT [facility] Provider Note   CSN: [id number] Arrival date & time: 05/03/23  1026     History HTN No chief complaint on file.   Brandy Reed is a 42 y.o. female.  42 y.o female with a PMH of HTN presents to the ED with a chief complaint of epigastric pain that is been ongoing for several years.  Reports that this acute pain started after having some [employer] on Saturday, has had some nausea and vomiting and feels like there is a stabbing pain under her left breast.  She has not taken anything for pain control.  Reports that previously in the past she was prescribed by her PCP to medications, she was told that she was constipated for 1, the second medication was to help with reflux.  Reports she used to drink something warm, or cold to help with her reflux.  She has had nausea and vomiting but these have been nonbilious, nonbloody emesis.  States she has not had a fever, no prior surgeries to her abdomen aside from her C-sections.  No other complaints reported.  The history is provided by the patient.       Home Medications Prior to Admission medications   Medication Sig Start Date End Date Taking? Authorizing Provider  albuterol (VENTOLIN HFA) 108 (90 Base) MCG/ACT inhaler Inhale 2 puffs every 4 hours by inhalation route as needed. 07/05/22     albuterol (VENTOLIN HFA) 108 (90 Base) MCG/ACT inhaler Inhale 2 puffs every 4 hours as needed. 07/12/22     amLODipine (NORVASC) 10 MG tablet Take 1 tablet (10 mg total) by mouth daily. 07/12/22     amLODipine (NORVASC) 5 MG tablet Take 1 tablet (5 mg total) by mouth daily. 12/14/17   Claiborne Rigg, NP  amoxicillin (AMOXIL) 500 MG tablet Take 500 mg by mouth 2 (two) times daily.    [provider]  azithromycin (ZITHROMAX) 250 MG tablet Take 2 tablets (500 mg) by mouth once daily for 1 day then take 1 tablet (250 mg) once daily for 4 days. 07/12/22     clindamycin (CLEOCIN) 300 MG capsule  Take 1 capsule (300 mg total) by mouth 3 (three) times daily. 08/08/18   Ocie Doyne, DMD  diphenhydrAMINE (BENADRYL) 25 mg capsule Take 25 mg by mouth at bedtime as needed.    [provider]  ibuprofen (ADVIL,MOTRIN) 800 MG tablet Take 800 mg by mouth every 8 (eight) hours as needed.    [provider]  loratadine (CLARITIN) 10 MG tablet Take 1 tablet (10 mg total) by mouth daily. 12/14/17   Claiborne Rigg, NP  oxyCODONE (OXY IR/ROXICODONE) 5 MG immediate release tablet Take 1 tablet (5 mg total) by mouth every 4 (four) hours as needed. 08/08/18   Ocie Doyne, DMD  Oxycodone HCl 10 MG TABS Take 1 tablet (10 mg total) by mouth 5 (five) times daily as needed for 7 days 07/23/22     oxyCODONE-acetaminophen (PERCOCET) 10-325 MG tablet Take 1 tablet by mouth every 4 (four) hours as needed for pain.    [provider]  promethazine-dextromethorphan (PROMETHAZINE-DM) 6.25-15 MG/5ML syrup Take 5 mLs by mouth every 4 (four) hours as needed. 08/16/22         Allergies    Patient has no known allergies.    Review of Systems   Review of Systems  Constitutional:  Negative for chills and fever.  Respiratory:  Positive for shortness of breath.   Cardiovascular:  Negative for chest pain.  Gastrointestinal:  Positive for abdominal pain, nausea and vomiting.  Genitourinary:  Negative for flank pain.  Musculoskeletal:  Negative for back pain.  All other systems reviewed and are negative.   Physical Exam Updated Vital Signs BP 115/78 (BP Location: Left Arm)   Pulse 85   Temp 98.1 F (36.7 C) (Oral)   Resp 17   Ht 5\' 3"  (1.6 m)   Wt 86.2 kg   SpO2 98%   BMI 33.66 kg/m  Physical Exam Vitals and nursing note reviewed.  Constitutional:      General: She is not in acute distress.    Appearance: She is well-developed.  HENT:     Head: Normocephalic and atraumatic.     Mouth/Throat:     Pharynx: No oropharyngeal exudate.  Eyes:     Pupils: Pupils are equal, round,  and reactive to light.  Cardiovascular:     Rate and Rhythm: Regular rhythm.     Heart sounds: Normal heart sounds.     Comments: No pitting edema Pulmonary:     Effort: Pulmonary effort is normal. No respiratory distress.     Breath sounds: Normal breath sounds.  Abdominal:     General: Bowel sounds are normal. There is no distension.     Palpations: Abdomen is soft.     Tenderness: There is abdominal tenderness in the epigastric area and left upper quadrant. There is no right CVA tenderness or left CVA tenderness.  Musculoskeletal:        General: No tenderness or deformity.     Cervical back: Normal range of motion.     Right lower leg: No edema.     Left lower leg: No edema.  Skin:    General: Skin is warm and dry.  Neurological:     Mental Status: She is alert and oriented to person, place, and time.     ED Results / Procedures / Treatments   Labs (all labs ordered are listed, but only abnormal results are displayed) Labs Reviewed  BASIC METABOLIC PANEL - Abnormal; Notable for the following components:      Result Value   Glucose, Bld 116 (*)    All other components within normal limits  CBC WITH DIFFERENTIAL/PLATELET - Abnormal; Notable for the following components:   WBC 13.0 (*)    RBC 5.17 (*)    MCV 79.3 (*)    MCH 25.9 (*)    Platelets 509 (*)    Neutro Abs 9.8 (*)    All other components within normal limits  BRAIN NATRIURETIC PEPTIDE  LIPASE, BLOOD  HEPATIC FUNCTION PANEL    EKG None  Radiology CT ABDOMEN PELVIS W CONTRAST  Result Date: 05/03/2023 CLINICAL DATA:  Epigastric pain for 4-5 days. Recent nausea vomiting and diarrhea. EXAM: CT ABDOMEN AND PELVIS WITH CONTRAST TECHNIQUE: Multidetector CT imaging of the abdomen and pelvis was performed using the standard protocol following bolus administration of intravenous contrast. RADIATION DOSE REDUCTION: This exam was performed according to the departmental dose-optimization program which includes  automated exposure control, adjustment of the mA and/or kV according to patient size and/or use of iterative reconstruction technique. CONTRAST:  OMNIPAQUE IOHEXOL 300 MG/ML  SOLN COMPARISON:  None Available. FINDINGS: Lower chest: Unremarkable Hepatobiliary: No suspicious focal abnormality in the liver on this study without intravenous contrast. There is no evidence for gallstones, gallbladder wall thickening, or pericholecystic fluid. No intrahepatic or extrahepatic biliary dilation. Pancreas: No focal mass lesion. No dilatation of  the main duct. No intraparenchymal cyst. No peripancreatic edema. Spleen: No splenomegaly. No suspicious focal mass lesion. Adrenals/Urinary Tract: No adrenal nodule or mass. Kidneys unremarkable. No evidence for hydroureter. The urinary bladder appears normal for the degree of distention. Stomach/Bowel: Tiny hiatal hernia. Stomach otherwise unremarkable. Duodenum is normally positioned as is the ligament of Treitz. No small bowel wall thickening. No small bowel dilatation. The terminal ileum is normal. The appendix is normal. No gross colonic mass. No colonic wall thickening. Vascular/Lymphatic: No abdominal aortic aneurysm. No abdominal aortic atherosclerotic calcification. There is no gastrohepatic or hepatoduodenal ligament lymphadenopathy. No retroperitoneal or mesenteric lymphadenopathy. No pelvic sidewall lymphadenopathy. Reproductive: Unremarkable. Other: No intraperitoneal free fluid. Musculoskeletal: No worrisome lytic or sclerotic osseous abnormality. IMPRESSION: 1. No acute findings in the abdomen or pelvis. Specifically, no findings to explain the patient's history of epigastric pain. 2. Tiny hiatal hernia. Electronically Signed   By: Kennith Center M.D.   On: 05/03/2023 14:01   DG Chest 2 View  Result Date: 05/03/2023 CLINICAL DATA:  History: Shortness of breath. Epigastric pain. Nausea/vomiting/diarrhea. EXAM: CHEST - 2 VIEW COMPARISON:  10/10/2018 and earlier.   Prior chest radiographs FINDINGS: Heart size within normal limits. A 6 mm nodular opacity projects at the level of the mid to upper right lung (see annotation on PA image). This is new from the prior chest radiograph 10/10/2018. No appreciable airspace consolidation elsewhere. No evidence of pleural effusion or pneumothorax. No acute osseous abnormality identified. Degenerative changes of the spine. IMPRESSION: 1. A 6 mm nodular opacity projects at the level of the mid-to-upper right lung on the PA radiograph. This is concerning for a possible pulmonary nodule and a chest CT is recommended for further evaluation. 2. Otherwise, there is no evidence of an acute cardiopulmonary abnormality. Electronically Signed   By: Jackey Loge D.O.   On: 05/03/2023 13:20    Procedures Procedures    Medications Ordered in ED Medications  ondansetron (ZOFRAN) injection 4 mg (4 mg Intravenous Not Given 05/03/23 1147)  sodium chloride 0.9 % bolus 1,000 mL (0 mLs Intravenous Stopped 05/03/23 1313)  pantoprazole (PROTONIX) injection 40 mg (40 mg Intravenous Given 05/03/23 1145)  iohexol (OMNIPAQUE) 300 MG/ML solution 100 mL (100 mLs Intravenous Contrast Given 05/03/23 1214)  morphine (PF) 4 MG/ML injection 4 mg (4 mg Intravenous Given 05/03/23 1233)    ED Course/ Medical Decision Making/ A&P                                 Medical Decision Making Amount and/or Complexity of Data Reviewed Labs: ordered. Radiology: ordered.  Risk Prescription drug management.     This patient presents to the ED for concern of epigastric pain, this involves a number of treatment options, and is a complaint that carries with it a high risk of complications and morbidity.  The differential diagnosis includes cholecystitis, reflux versus ACS.    Co morbidities: Discussed in HPI   Brief History:  See HPI.   EMR reviewed including pt PMHx, past surgical history and past visits to ER.   See HPI for more details   Lab  Tests:  I ordered and independently interpreted labs.  The pertinent results include:    CBC with a slight leukocytosis of 13.0, although this appears to be chronic for patient.  Hemoglobin is stable without any signs of anemia.  Platelets continue to trend upward.  BMP with no electrolyte derangement, creatinine levels  unremarkable, BUN is normal.  BNP is negative, no signs of fluid overload on my exam.   Imaging Studies:  Chest xray showed: 1. A 6 mm nodular opacity projects at the level of the mid-to-upper  right lung on the PA radiograph. This is concerning for a possible  pulmonary nodule and a chest CT is recommended for further  evaluation.  2. Otherwise, there is no evidence of an acute cardiopulmonary  abnormality.   CT abdomen   Cardiac Monitoring:  The patient was maintained on a cardiac monitor.  I personally viewed and interpreted the cardiac monitored which showed an underlying rhythm of: NSR 83 EKG non-ischemic   Medicines ordered:  I ordered medication including zofran, protonix, bolus  for symptomatic treatment.  Reevaluation of the patient after these medicines showed that the patient improved I have reviewed the patients home medicines and have made adjustments as needed  Reevaluation:  After the interventions noted above I re-evaluated patient and found that they have :improved  Social Determinants of Health:  The patient's social determinants of health were a factor in the care of this patient  Problem List / ED Course:  Patient with underlying epigastric pain that is been ongoing for several months, previously worked up by PCP, had 2 medications prescribed, thought that it was likely due to constipation as she is on recurrent chronic hydrocodone, endorses epigastric pain, feeling somewhat reflux that makes her short of breath.  She arrived to the ED hemodynamically stable, no tachycardia, no tachypnea or hypoxia.  Completion of her blood work revealed a  CBC with a recurrent leukocytosis.  Hemoglobin is normal.  BMP with no electrolyte derangement, creatinine levels unremarkable.  BUN is normal.  Hepatic function is unremarkable, not particularly tender along the right upper quadrant to suggest gallbladder etiology without any nausea or vomiting.  BNP is negative, no signs of fluid overload on exam.  Lipase level is negative. Chest x-ray does show a 6 mm nodule, she does endorse cigar smoking, we discussed appropriate follow-up with her PCP in order to obtain a CT of her chest.  Unfortunately, patient is pressed for time at this time and needs to go pick up her children therefore were unable to fully complete the workup.  She did receive morphine, bolus, Zofran to help with symptomatic treatment.  She is not sure if the Protonix did help her symptoms, I do feel that seeing as she has had this recurrent discomfort for several months and even a year she needs appropriate gastroenterology referral, which was given to her on today's visit. I do not suspect cardiac etiology at this time, as pain seems to be more epigastric, EKG is nonischemic, and she is not having any chest pain, she describes her shortness of breath coming from the burning sensation along the epigastric region.  Patient understands and agrees with management, return precautions discussed at length.   Dispostion:  After consideration of the diagnostic results and the patients response to treatment, I feel that the patent would benefit from outpatient follow up with PCP.    Portions of this note were generated with Scientist, clinical (histocompatibility and immunogenetics). Dictation errors may occur despite best attempts at proofreading.   Final Clinical Impression(s) / ED Diagnoses Final diagnoses:  Epigastric pain    Rx / DC Orders ED Discharge Orders     None         Claude Manges, PA-C 05/03/23 1412    Coral Spikes, DO 05/03/23 1517

## 2023-05-03 NOTE — ED Triage Notes (Signed)
In for eval of epigastric pain x 4-5 days with SOB after eating [place]. Nausea/vomiting/diarrhea on the first day but none since. Resp even and unlabored.

## 2023-12-21 ENCOUNTER — Ambulatory Visit: Admitting: Pulmonary Disease

## 2024-01-18 ENCOUNTER — Ambulatory Visit: Admitting: Student in an Organized Health Care Education/Training Program
